# Patient Record
Sex: Female | Born: 1960 | Race: Black or African American | Hispanic: No | Marital: Single | Smoking: Never smoker
Health system: Southern US, Community
[De-identification: ages and names within clinical notes are randomized; demographics above are authoritative.]

## PROBLEM LIST (undated history)

## (undated) DIAGNOSIS — M549 Dorsalgia, unspecified: Secondary | ICD-10-CM

## (undated) DIAGNOSIS — M542 Cervicalgia: Secondary | ICD-10-CM

## (undated) DIAGNOSIS — R112 Nausea with vomiting, unspecified: Secondary | ICD-10-CM

## (undated) DIAGNOSIS — M25519 Pain in unspecified shoulder: Secondary | ICD-10-CM

## (undated) DIAGNOSIS — Z9889 Other specified postprocedural states: Secondary | ICD-10-CM

## (undated) DIAGNOSIS — M479 Spondylosis, unspecified: Secondary | ICD-10-CM

## (undated) DIAGNOSIS — R102 Pelvic and perineal pain: Secondary | ICD-10-CM

## (undated) HISTORY — DX: Pelvic and perineal pain: R10.2

## (undated) HISTORY — DX: Spondylosis, unspecified: M47.9

## (undated) HISTORY — PX: ABDOMINAL HYSTERECTOMY: SHX81

## (undated) HISTORY — DX: Pain in unspecified shoulder: M25.519

## (undated) HISTORY — DX: Cervicalgia: M54.2

## (undated) HISTORY — DX: Dorsalgia, unspecified: M54.9

---

## 1992-03-19 HISTORY — PX: NASAL SINUS SURGERY: SHX719

## 1996-03-19 HISTORY — PX: VESICOVAGINAL FISTULA CLOSURE W/ TAH: SUR271

## 1999-03-22 ENCOUNTER — Encounter: Payer: Self-pay | Admitting: Obstetrics and Gynecology

## 1999-03-22 ENCOUNTER — Encounter: Admission: RE | Admit: 1999-03-22 | Discharge: 1999-03-22 | Payer: Self-pay | Admitting: Obstetrics and Gynecology

## 2000-03-19 HISTORY — PX: OTHER SURGICAL HISTORY: SHX169

## 2000-06-04 ENCOUNTER — Encounter: Admission: RE | Admit: 2000-06-04 | Discharge: 2000-06-04 | Payer: Self-pay | Admitting: Obstetrics and Gynecology

## 2000-06-04 ENCOUNTER — Encounter: Payer: Self-pay | Admitting: Obstetrics and Gynecology

## 2002-02-04 ENCOUNTER — Other Ambulatory Visit: Admission: RE | Admit: 2002-02-04 | Discharge: 2002-02-04 | Payer: Self-pay | Admitting: Gynecology

## 2002-04-02 ENCOUNTER — Encounter: Payer: Self-pay | Admitting: Gynecology

## 2002-04-02 ENCOUNTER — Encounter: Admission: RE | Admit: 2002-04-02 | Discharge: 2002-04-02 | Payer: Self-pay | Admitting: Gynecology

## 2002-10-02 ENCOUNTER — Encounter: Payer: Self-pay | Admitting: Orthopedic Surgery

## 2002-12-21 ENCOUNTER — Encounter: Payer: Self-pay | Admitting: Orthopedic Surgery

## 2003-03-25 ENCOUNTER — Other Ambulatory Visit: Admission: RE | Admit: 2003-03-25 | Discharge: 2003-03-25 | Payer: Self-pay | Admitting: Gynecology

## 2003-04-13 ENCOUNTER — Encounter: Admission: RE | Admit: 2003-04-13 | Discharge: 2003-04-13 | Payer: Self-pay | Admitting: Gynecology

## 2004-01-18 ENCOUNTER — Ambulatory Visit: Payer: Self-pay | Admitting: Gastroenterology

## 2004-02-04 ENCOUNTER — Ambulatory Visit: Payer: Self-pay | Admitting: Gastroenterology

## 2004-02-24 ENCOUNTER — Ambulatory Visit: Payer: Self-pay | Admitting: Gastroenterology

## 2004-03-08 ENCOUNTER — Ambulatory Visit: Payer: Self-pay | Admitting: Orthopedic Surgery

## 2004-03-28 ENCOUNTER — Other Ambulatory Visit: Admission: RE | Admit: 2004-03-28 | Discharge: 2004-03-28 | Payer: Self-pay | Admitting: Gynecology

## 2004-06-26 ENCOUNTER — Ambulatory Visit: Payer: Self-pay | Admitting: Gastroenterology

## 2005-04-03 ENCOUNTER — Other Ambulatory Visit: Admission: RE | Admit: 2005-04-03 | Discharge: 2005-04-03 | Payer: Self-pay | Admitting: Gynecology

## 2005-04-20 ENCOUNTER — Encounter: Admission: RE | Admit: 2005-04-20 | Discharge: 2005-04-20 | Payer: Self-pay | Admitting: Gynecology

## 2005-08-23 ENCOUNTER — Ambulatory Visit: Payer: Self-pay | Admitting: Internal Medicine

## 2005-10-08 ENCOUNTER — Encounter: Payer: Self-pay | Admitting: Orthopedic Surgery

## 2005-12-03 ENCOUNTER — Ambulatory Visit: Payer: Self-pay | Admitting: Orthopedic Surgery

## 2005-12-24 ENCOUNTER — Ambulatory Visit: Payer: Self-pay | Admitting: Orthopedic Surgery

## 2006-01-14 ENCOUNTER — Ambulatory Visit: Payer: Self-pay | Admitting: Orthopedic Surgery

## 2006-02-12 ENCOUNTER — Ambulatory Visit: Payer: Self-pay | Admitting: Orthopedic Surgery

## 2006-04-11 ENCOUNTER — Other Ambulatory Visit: Admission: RE | Admit: 2006-04-11 | Discharge: 2006-04-11 | Payer: Self-pay | Admitting: Gynecology

## 2006-04-24 ENCOUNTER — Ambulatory Visit: Payer: Self-pay | Admitting: Cardiology

## 2007-04-03 ENCOUNTER — Ambulatory Visit: Payer: Self-pay | Admitting: Orthopedic Surgery

## 2007-04-03 DIAGNOSIS — M25519 Pain in unspecified shoulder: Secondary | ICD-10-CM | POA: Insufficient documentation

## 2007-04-18 ENCOUNTER — Encounter: Payer: Self-pay | Admitting: Orthopedic Surgery

## 2007-04-24 ENCOUNTER — Ambulatory Visit: Payer: Self-pay | Admitting: Orthopedic Surgery

## 2007-04-24 DIAGNOSIS — M7580 Other shoulder lesions, unspecified shoulder: Secondary | ICD-10-CM

## 2007-04-24 DIAGNOSIS — M25819 Other specified joint disorders, unspecified shoulder: Secondary | ICD-10-CM | POA: Insufficient documentation

## 2007-05-12 ENCOUNTER — Telehealth: Payer: Self-pay | Admitting: Orthopedic Surgery

## 2007-05-28 ENCOUNTER — Ambulatory Visit: Payer: Self-pay | Admitting: Orthopedic Surgery

## 2008-05-18 ENCOUNTER — Encounter: Payer: Self-pay | Admitting: Internal Medicine

## 2008-05-31 ENCOUNTER — Ambulatory Visit: Payer: Self-pay | Admitting: Orthopedic Surgery

## 2008-05-31 DIAGNOSIS — M5430 Sciatica, unspecified side: Secondary | ICD-10-CM | POA: Insufficient documentation

## 2008-05-31 DIAGNOSIS — M79609 Pain in unspecified limb: Secondary | ICD-10-CM | POA: Insufficient documentation

## 2008-05-31 DIAGNOSIS — M25559 Pain in unspecified hip: Secondary | ICD-10-CM | POA: Insufficient documentation

## 2008-05-31 DIAGNOSIS — M549 Dorsalgia, unspecified: Secondary | ICD-10-CM | POA: Insufficient documentation

## 2008-06-04 ENCOUNTER — Telehealth: Payer: Self-pay | Admitting: Orthopedic Surgery

## 2008-06-09 ENCOUNTER — Ambulatory Visit (HOSPITAL_COMMUNITY): Admission: RE | Admit: 2008-06-09 | Discharge: 2008-06-09 | Payer: Self-pay | Admitting: Orthopedic Surgery

## 2008-06-17 ENCOUNTER — Ambulatory Visit: Payer: Self-pay | Admitting: Orthopedic Surgery

## 2008-06-22 ENCOUNTER — Telehealth: Payer: Self-pay | Admitting: Orthopedic Surgery

## 2009-01-11 ENCOUNTER — Ambulatory Visit: Payer: Self-pay | Admitting: Orthopedic Surgery

## 2009-01-11 DIAGNOSIS — M7150 Other bursitis, not elsewhere classified, unspecified site: Secondary | ICD-10-CM | POA: Insufficient documentation

## 2009-01-27 ENCOUNTER — Ambulatory Visit: Payer: Self-pay | Admitting: Orthopedic Surgery

## 2009-03-16 ENCOUNTER — Telehealth: Payer: Self-pay | Admitting: Orthopedic Surgery

## 2009-04-11 ENCOUNTER — Ambulatory Visit: Payer: Self-pay | Admitting: Orthopedic Surgery

## 2009-04-11 DIAGNOSIS — M479 Spondylosis, unspecified: Secondary | ICD-10-CM | POA: Insufficient documentation

## 2009-04-11 HISTORY — DX: Spondylosis, unspecified: M47.9

## 2009-04-13 ENCOUNTER — Encounter: Payer: Self-pay | Admitting: Orthopedic Surgery

## 2009-04-14 ENCOUNTER — Telehealth: Payer: Self-pay | Admitting: Orthopedic Surgery

## 2009-04-19 ENCOUNTER — Telehealth: Payer: Self-pay | Admitting: Orthopedic Surgery

## 2009-04-21 ENCOUNTER — Telehealth: Payer: Self-pay | Admitting: Orthopedic Surgery

## 2009-04-22 ENCOUNTER — Telehealth (INDEPENDENT_AMBULATORY_CARE_PROVIDER_SITE_OTHER): Payer: Self-pay | Admitting: *Deleted

## 2009-04-22 ENCOUNTER — Telehealth: Payer: Self-pay | Admitting: Orthopedic Surgery

## 2009-04-26 ENCOUNTER — Ambulatory Visit (HOSPITAL_COMMUNITY): Admission: RE | Admit: 2009-04-26 | Discharge: 2009-04-26 | Payer: Self-pay | Admitting: Orthopedic Surgery

## 2009-04-28 ENCOUNTER — Telehealth: Payer: Self-pay | Admitting: Orthopedic Surgery

## 2009-05-02 ENCOUNTER — Ambulatory Visit: Payer: Self-pay | Admitting: Orthopedic Surgery

## 2009-05-10 ENCOUNTER — Encounter: Payer: Self-pay | Admitting: Orthopedic Surgery

## 2009-05-11 ENCOUNTER — Telehealth: Payer: Self-pay | Admitting: Orthopedic Surgery

## 2009-06-06 ENCOUNTER — Telehealth: Payer: Self-pay | Admitting: Orthopedic Surgery

## 2009-06-09 ENCOUNTER — Encounter: Payer: Self-pay | Admitting: Orthopedic Surgery

## 2009-06-21 ENCOUNTER — Encounter (INDEPENDENT_AMBULATORY_CARE_PROVIDER_SITE_OTHER): Payer: Self-pay | Admitting: *Deleted

## 2009-06-21 ENCOUNTER — Ambulatory Visit: Payer: Self-pay | Admitting: Orthopedic Surgery

## 2009-06-22 ENCOUNTER — Encounter (INDEPENDENT_AMBULATORY_CARE_PROVIDER_SITE_OTHER): Payer: Self-pay | Admitting: *Deleted

## 2009-06-22 ENCOUNTER — Encounter: Payer: Self-pay | Admitting: Orthopedic Surgery

## 2009-06-23 ENCOUNTER — Telehealth: Payer: Self-pay | Admitting: Orthopedic Surgery

## 2009-06-24 ENCOUNTER — Ambulatory Visit: Payer: Self-pay | Admitting: Orthopedic Surgery

## 2009-06-24 ENCOUNTER — Ambulatory Visit (HOSPITAL_COMMUNITY): Admission: RE | Admit: 2009-06-24 | Discharge: 2009-06-24 | Payer: Self-pay | Admitting: Orthopedic Surgery

## 2009-06-28 ENCOUNTER — Ambulatory Visit: Payer: Self-pay | Admitting: Orthopedic Surgery

## 2009-07-06 ENCOUNTER — Ambulatory Visit: Payer: Self-pay | Admitting: Orthopedic Surgery

## 2009-08-09 ENCOUNTER — Ambulatory Visit: Payer: Self-pay | Admitting: Orthopedic Surgery

## 2009-09-13 ENCOUNTER — Ambulatory Visit: Payer: Self-pay | Admitting: Orthopedic Surgery

## 2009-09-13 DIAGNOSIS — M7710 Lateral epicondylitis, unspecified elbow: Secondary | ICD-10-CM | POA: Insufficient documentation

## 2009-11-16 ENCOUNTER — Ambulatory Visit: Payer: Self-pay | Admitting: Orthopedic Surgery

## 2010-01-04 ENCOUNTER — Ambulatory Visit: Payer: Self-pay | Admitting: Orthopedic Surgery

## 2010-02-28 ENCOUNTER — Ambulatory Visit: Payer: Self-pay | Admitting: Orthopedic Surgery

## 2010-03-02 ENCOUNTER — Telehealth: Payer: Self-pay | Admitting: Orthopedic Surgery

## 2010-04-18 NOTE — Consult Note (Signed)
Summary: Office from Dr. Gerilyn Pilgrim  Office from Dr. Gerilyn Pilgrim   Imported By: Jacklynn Ganong 05/13/2009 09:15:23  _____________________________________________________________________  External Attachment:    Type:   Image     Comment:   External Document

## 2010-04-18 NOTE — Assessment & Plan Note (Signed)
Summary: 2 M RE-CHECK RT ELBOW/UHC/CAF   Visit Type:  Follow-up  CC:  recheck elbow.  History of Present Illness: I saw Sheila Patrick in the office today for a 2 month followup visit.  She is a 50 years old woman with the complaint of:  right elbow pain   s/p injection which helped for a short time.   Prednisone (pak) 5 Mg Tabs    uses biofreeze and ace bandage and this feels better.  SHE HAS NOT BEEN AS COMPLIANT WITH EXERCISES OR BRACING AND PREFERS ACE WRAP.      Allergies: 1)  ! * Some Dye in Laxatives  Physical Exam  Additional Exam:  RIGHT ELBOW  LATERAL TENDERNESS OVER THE RADILA HEAD ' ROM IS NORMAL AND FULL NEG TINELS  ELBOW IS STABLE      Impression & Recommendations:  Problem # 1:  LATERAL EPICONDYLITIS (ICD-726.32) Assessment Unchanged  Orders: Est. Patient Level II ([address])  Patient Instructions: 1)  Continue exercises for the elbow 2)  Use either ace wrap or elbow brace for 6 weeks continuously except for bathing 3)  Continue Biofreeze   4)  come back in 6 weeks   Orders Added: 1)  Est. Patient Level II [[address]454]

## 2010-04-18 NOTE — Letter (Signed)
Summary: FMLA form   FMLA form   Imported By: Cammie Sickle 04/23/2009 19:39:23  _____________________________________________________________________  External Attachment:    Type:   Image     Comment:   External Document

## 2010-04-18 NOTE — Progress Notes (Signed)
Summary: call from patient   Phone Note Call from Patient   Caller: Patient Summary of Call: Patient called to check on status of MRI, and to relay that she has also called chiropracter for appointment due to arm hurting and keeping her up at night. Initial call taken by: Cammie Sickle,  April 21, 2009 9:37 AM

## 2010-04-18 NOTE — Progress Notes (Signed)
Summary: getting second opinion  Phone Note Call from Patient   Summary of Call: Sheila Patrick(May 16, 2060) wanted you to know she is getting a second opinion on her hand. Her # [phone] Initial call taken by: Karenann Cai

## 2010-04-18 NOTE — Assessment & Plan Note (Signed)
Summary: mri results bringing disc/uhc/bsf   Visit Type:  Follow-up  CC:  right arm pain.  History of Present Illness: I saw Sheila Patrick in the office today for a followup visit.  She is a 50 years old woman with the complaint of:  right arm pain.  MRI results.  IMPRESSION: Essentially negative examination.  Very minimal disc bulges in the cervical region but no advanced disease.  No herniation or significant osteophytic disease.  No stenosis of the canal or foramina.   Read By:  Thomasenia Sales,  M.D.      she also has a pending. Lower extremity nerve conduction study. She is complaining of some back pain. She has an L5-S1 disc or possible S1 encroachment and that seems to bother her on the LEFT side as well.     Allergies: 1)  ! * Some Dye in Laxatives  Physical Exam  Additional Exam:  she has positive signs of impingement on the RIGHT. Strength in both rotator cuff pain with Hawkins maneuver and Neer maneuver. No instability. Neurovascular exam remained intact. Grossly, despite the symptoms of radicular pain in the RIGHT arm.   Impression & Recommendations:  Problem # 1:  ARTHRITIS, CERVICAL SPINE (ICD-721.90)  Orders: Est. 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 There were no complications  Orders: Est. Patient Level III ([address]) Joint Aspirate / Injection, Large (20610) Depo- Medrol 40mg  (J1030)  Problem # 3:  SCIATICA (ICD-724.3)  LEFT leg pain, pending. Lower extremity nerve conduction Her updated medication list for this problem includes:  neurontin 100mg  1-2 at bedtime # 90/5 refills     Ultram 50 Mg Tabs (Tramadol hcl) .[name]... 1 by mouth q 4 as needed pain  Orders: Est. Patient Level III (09811)  Patient Instructions: 1)  You have received an injection of cortisone today.  You may experience increased pain at the injection site. Apply ice pack to the area for 20 minutes every 2 hours and take 2 xtra strength tylenol every 8 hours. This increased pain will usually resolve in 24 hours. The injection will take effect in 3-10 days.  2)  return after nerve conduction study. Lower extremities. 3)  Continue with chiropractic treatment.

## 2010-04-18 NOTE — Assessment & Plan Note (Signed)
Summary: rt thumb schedule trigger release/uhc.cbt   Visit Type:  Follow-up  CC:  recheck right thumb.  History of Present Illness: 50 year old female with multiple joint aches recently got a 2nd opinion regarding her RIGHT thumb and was advised that she needs a trigger finger release.  She had nerve conduction studies, which were incomplete because the EMGs were done, but there was no radicular radiculopathy noted on the nerve conduction study portion.  She is eager to get a trigger release, done on the RIGHT thumb.  She has pain and tenderness over the A1 pulley with catching.   Goes to chiropractor for shoulder and arm.  05/10/09 NCS [place] in EMR for review.  Started Glucosamine and Chondroitin, no relief AFETR 3 WEEKS   NCS: NORMAL   Meds: No meds for pain.        Allergies: 1)  ! * Some Dye in Laxatives   Impression & Recommendations:  Problem # 1:  TRIGGER FINGER (ICD-727.3) Assessment Deteriorated  dictated history at a later date / incorp by ref   Orders: Est. Patient Level III ([address])  Medications Added to Medication List This Visit: 1)  Aspir-low 81 Mg Tbec (Aspirin)  Patient Instructions: 1)  postop appointment. 2)  She will call to advise [place] what must relaxer she took 3)  DOS 06/24/09 4)  Preop 06/22/09 at 3 o clock, take packet with you 5)  FU on 06/28/09

## 2010-04-18 NOTE — Progress Notes (Signed)
Summary: call from Dr Ronal Fear office, pt holding on appointment  Phone Note From Other Clinic   Caller: Referral Coordinator Summary of Call: Dawn from Dr Ronal Fear office, ph [phone], called to relay that she called patient to schedule referral appt/nerve conduction study; patient told her she wants to wait until after the MRI. Initial call taken by: Cammie Sickle,  April 14, 2009 2:20 PM

## 2010-04-18 NOTE — Letter (Signed)
Summary: *Consult Note  [facility]  [address]  [place], [place] [address]   Phone: [phone]  Fax: [phone]    Re:    Sheila Patrick DOB:    15-Mar-1961   Dear:    Danae Chen you for requesting that we see the above patient for consultation.  A copy of the detailed office note will be sent under separate cover, for your review.  Evaluation today is consistent with:    Our recommendation is for:    New Orders include:    New Medications started today include:    After today's visit, the patients current medications include: 1)  * CLARINEX  2)  * NASACORT AQ  3)  * CALCIUM  4)  ASPIR-LOW 81 MG TBEC (ASPIRIN)    Thank you for this consultation.  If you have any further questions regarding the care of this patient, please do not hesitate to contact me @   Thank you for this opportunity to look after your patient.  Sincerely,   Fuller Canada MD

## 2010-04-18 NOTE — Letter (Signed)
Summary: Out of Work  [facility]  [address]  [place], [place] [address]   Phone: [phone]  Fax: [phone]    July 06, 2009   Employee:  Sheila Patrick    To Whom It May Concern:   Return to work July 11, 2009  If you need additional information, please feel free to contact our office.         Sincerely,    Fuller Canada MD

## 2010-04-18 NOTE — Letter (Signed)
Summary: Work [facility]  [address]  [place], [place] [address]   Phone: [phone]  Fax: [phone]     Today's Date: June 21, 2009  Name of Patient: Sheila Patrick  The above named patient had a medical visit today at:  2:30 pm.  Please take this into consideration when reviewing the time away from work.   Special Instructions:  [ X ] To be off the remainder of today, returning to the normal work schedule tomorrow,  June 22, 2009.   [ X ] Other  Please excuse for: Patient / Employee appointment on June 22, 2009 at 3:00 (pre-operative appointment)  Please excuse for:  Out of work dates, secondary to surgery:  Start: June 24, 2009  End:  July 08, 2009  Estimated Return to work:   July 11, 2009   _____   Sincerely,   Terrance Mass, MD

## 2010-04-18 NOTE — Miscellaneous (Signed)
Summary: No pre-cert required for out-patient procedure  Clinical Lists Changes   Contacted insurer, [employer], regarding out-patient surgery (CPT code 69629, scheduled at [facility] - 06/24/09; per Aleatha Borer, no pre-cert is required for out-patient procedure, as in network.

## 2010-04-18 NOTE — Progress Notes (Signed)
Summary: MRI pending, waiting on clinical information, physician review  Phone Note Outgoing Call   Call placed to: Patient Summary of Call: Call to patient to inform of MRI status and request from [facility] insurance re: further review.  MRI appointment pending. * REF: Case # [id number], PH# [phone], option 4, physician. *  (Patient still wishes to hold on NCS scheduling with Dr Gerilyn Pilgrim until MRI is done). Initial call taken by: Cammie Sickle,  April 19, 2009 9:52 AM

## 2010-04-18 NOTE — Letter (Signed)
Summary: [facility] Medical Service Request  MRI C-spine  [facility] Medical Service Request  MRI C-spine   Imported By: Cammie Sickle 04/19/2009 09:55:05  _____________________________________________________________________  External Attachment:    Type:   Image     Comment:   External Document

## 2010-04-18 NOTE — Progress Notes (Signed)
Summary: Referral for NCS, lower extremities.  Phone Note Outgoing Call   Call placed by: Waldon Reining,  April 28, 2009 12:53 PM Call placed to: Specialist Action Taken: Information Sent Summary of Call: I refaxed a referral for this patient to Dr. Gerilyn Pilgrim  for NCS of lower extremities, for numbness in toes.

## 2010-04-18 NOTE — Assessment & Plan Note (Signed)
Summary: 2 M RE-CK RT ELBOW/POST OP HAND SURG 06/28/09/UHC/CAF   Visit Type:  Follow-up  CC:  right elbow.  History of Present Illness: I saw Sheila Patrick in the office today for a 2 month followup visit.  She is a 50 years old woman with the complaint of:  right elbow  Prednisone (pak) 5 Mg Tabs given on last visit. She took the medication and it did not really help.  She states she is a little better.  she's been worked up for several problems including pain in the cervical spine, pain in the RIGHT shoulder, elbow, had surgery in the RIGHT hand for triggering  Her RIGHT hand is improved in terms of the triggering.  She still has some lateral elbow pain the shoulder and neck appear to be improved  We've had nothing to really having I had him in terms of her upper extremity pain although its better it is still present she would really like to get rid of it  Today primarily focusing on the RIGHT elbow with pain on the lateral epicondyles she has tennis elbow exercises somewhat noncompliant with those and she also has a brace.  She does note that she's not had to do the same types of activities at work and this may have helped  She has tenderness over the lateral epicondyles.  She has a positive resistance test with the arm in extension and wrist resisted extension.  Tennis elbow recommend injection  Inject RIGHT elbow following technique The  elbow was prepped with alcohol and anesthetized with ethyl chloride. 40 mg of Depo-Medrol and 5 cc of 1% lidocaine were injected along the lateral epicondyle. No complications. Verbal consent was obtained prior to injection.    Allergies: 1)  ! * Some Dye in Laxatives   Impression & Recommendations:  Problem # 1:  LATERAL EPICONDYLITIS (ICD-726.32) Assessment New  Orders: Est. Patient Level III ([address]) Injection, Tendon / Ligament ([address]) Depo- Medrol 40mg  (J1030)  Patient Instructions: 1)  You have received an injection of  cortisone today. You may experience increased pain at the injection site. Apply ice pack to the area for 20 minutes every 2 hours and take 2 xtra strength tylenol every 8 hours. This increased pain will usually resolve in 24 hours. The injection will take effect in 3-10 days.  2)  1 1/2 months

## 2010-04-18 NOTE — Assessment & Plan Note (Signed)
Summary: RE-CK RT HAND/POST OP SURG 06/28/09/UHC/CAF   Visit Type:  Follow-up  CC:  right hand.  History of Present Illness: I saw Sheila Patrick in the office today for a followup visit.  She is a 50 years old woman with the complaint of:  right hand  06/24/09 right trigger thumb release.  No med needed.  She does complain of some discomfort proximal lateral forearm with some pain on power grip maneuvers, denies numbness tingling on the RIGHT forearm or hand  Does complain of some volar pain in the distal portion of the forearm  RIGHT trigger finger has been relieved.  Exam normal incision no numbness tingling in the thumb  Tenderness over the proximal forearm musculature involving the ECRB and ECRL no pain with extension of the wrist even with the elbow extended so she may have an intersection syndrome or nerve entrapment of the Lister interosseous nerve but there is no weakness  Pulses are good color is good no axillar adenopathy  Impression possible tennis elbow vs. 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 * Some Dye in Laxatives   Impression & Recommendations:  Problem # 1:  AFTERCARE FOLLOW SURGERY SKIN&SUBCUT TISSUE NEC (ICD-V58.77) Assessment Improved  Problem # 2:  ARTHRITIS, CERVICAL SPINE (ICD-721.90) Assessment: Deteriorated  prednisone Dosepak continue chiropractic care  Orders: Est. Patient Level III ([address])  Problem # 3:  TRIGGER FINGER (ICD-727.3) Assessment: Improved  Orders: Post-Op Check ([address])  Problem # 4:  LATERAL EPICONDYLITIS (ICD-726.32) Assessment: New  exercise program, tennis elbow brace, ice, prednisone dose, followup 2  months  Orders: Est. Patient Level III (09811)  Medications Added to Medication List This Visit: 1)  Prednisone (pak) 5 Mg Tabs (Prednisone) .... As directed  Patient Instructions: 1)  try prednisone pack  2)  tennis elbow exercises x 6 weeks  3)  apply ice to the arm 20 minutes 2-3 x  daily  4)  return in 2 months  Prescriptions: PREDNISONE (PAK) 5 MG TABS (PREDNISONE) as directed  #1 x 1   Entered and Authorized by:   Fuller Canada MD   Signed by:   Fuller Canada MD on 09/13/2009   Method used:   Print then Give to Patient   RxID:   [id number]

## 2010-04-18 NOTE — Progress Notes (Signed)
Summary: MRI appointment.  Phone Note Outgoing Call   Call placed by: Waldon Reining,  April 22, 2009 8:13 AM Call placed to: Patient Action Taken: Appt scheduled Summary of Call: The patient has an MRI appointment at [facility] on 04-26-09 at 7:30am. Patient has [facility], authorization [phone] and expires 45 days from 04-20-09. Patient will follow up back here for her results.

## 2010-04-18 NOTE — Progress Notes (Signed)
Summary: call from patient medication information  Phone Note Call from Patient   Caller: Patient Summary of Call: Patient called as per last office to relay name of medication: Flexoril 10 mg, 1 per day.  Left her work ph# to call if anything further needed. [facility] [employer] [facility] [phone]  NEED PHARMACY  Initial call taken by: Cammie Sickle,  June 23, 2009 8:49 AM  Follow-up for Phone Call        Pharmacy is [facility] [place] Follow-up by: Cammie Sickle,  June 23, 2009 9:27 AM  Additional Follow-up for Phone Call Additional follow up Details #1::        I faxed the med Additional Follow-up by: Ether Griffins,  June 23, 2009 9:31 AM    New/Updated Medications: FLEXERIL 10 MG TABS (CYCLOBENZAPRINE HCL) one by mouth q 8 hrs as needed Prescriptions: FLEXERIL 10 MG TABS (CYCLOBENZAPRINE HCL) one by mouth q 8 hrs as needed  #60 x 1   Entered by:   Ether Griffins   Authorized by:   Fuller Canada MD   Signed by:   Ether Griffins on 06/23/2009   Method used:   [facility]axed to ...       [facility]  [id number] (retail)       [address]       [place], [place]  [address]       Ph: [phone] or [phone]       Fax: [phone]   RxID:   [id number]

## 2010-04-18 NOTE — Miscellaneous (Signed)
Sheila Patrick Date of birth 67  8 1962 Medical record number: [id number]  Date June 21, 2009  Chief complaint catching and pain, RIGHT thumb  50 year old female presents with recurrent complaints of pain in the RIGHT thumb over the A1 pulley. She's had an injection. She still continues to complain of pain. She had a 2nd opinion by Dr. Wynonia Musty in [place] and he recommended trigger finger release. We discussed her treatment options, which include a repeat injection. She is, of course to taking oral pain medications, including anti-inflammatories, so she basically has been LEFT with the options of a trigger finger release were another injection. She would rather have the surgery. Informed consent was done in the office. She understands risk of nerve injury, and numbness to the finger, and recurrence of the triggering.  Current Allergies (reviewed today): ! * SOME DYE IN LAXATIVES  Past Medical History:    Reviewed history from 02/11/2007 and no changes required:       back pain       neck pain       shoulder pain  Past Surgical History:    Reviewed history from 02/11/2007 and no changes required:       rt breast lumpectomy in 2002       hysterectomy 1998       sinus surgery 1994    Risk Factors:   Review of Systems  General      Denies weight loss, weight gain, fever, chills, and fatigue.  Cardiac      Denies chest pain, angina, heart attack, heart failure, poor circulation, blood clots, and phlebitis.  Resp      Denies short of breath, difficulty breathing, COPD, cough, and pneumonia.  GI      Denies nausea, vomiting, diarrhea, constipation, difficulty swallowing, ulcers, GERD, and reflux.  GU      Denies kidney failure, kidney transplant, kidney stones, burning, poor stream, testicular cancer, blood in urine, and .  Neuro      Denies headache, dizziness, migraines, numbness, weakness, tremor, and unsteady walking.  MS      Denies joint pain, rheumatoid arthritis,  joint swelling, gout, bone cancer, osteoporosis, and .  Endo      Denies thyroid disease, goiter, and diabetes.  Psych      Denies depression, mood swings, anxiety, panic attack, bipolar, and schizophrenia.  Derm      Denies eczema, cancer, and itching.  EENT      Denies poor vision, cataracts, glaucoma, poor hearing, vertigo, ears ringing, sinusitis, hoarseness, toothaches, and bleeding gums.  Immunology      Denies seasonal allergies, sinus problems, and allergic to bee stings.  Lymphatic      Denies lymph node cancer and lymph edema.     Exam  General:    Well-developed, well-nourished, normal body habitus; no deformities, normal grooming.    Skin:    Intact, no scars, lesions, rashes, cafe au lait spots or bruising.    Inspection:    Inspection is normal.    Vascular:    Radial, ulnar, brachial, and axillary pulses 2+ and symmetric; capillary refill less than 2 seconds; no evidence of ischemia, clubbing, or cyanosis.    Sensory:    Gross sensation intact in the upper extremities.    Motor:    Normal strength in the upper extremities.   RIGHT thumb: No deformities are seen. There is tenderness over the A1 pulley. She has full range of motion, but painful flexion of the interphalangeal  joint. There is no instability. Normal flexion power.  Impression triggering recurrent RIGHT thumb.  Recommend trigger release, RIGHT thumb.  Fuller Canada , MD

## 2010-04-18 NOTE — Assessment & Plan Note (Signed)
Summary: POST OP 1/RT HAND/UHC/CAF   Visit Type:  post op   CC:  post op #1 and right hand.  History of Present Illness: postop check. #1 this is postop day 4 status post RIGHT thumb trigger release.  The wound looks fine. The patient can move the finger with no triggering. She is encouraged to do exercises, which I showed her. Follow up on the 20th for stitch removal   Allergies: 1)  ! * Some Dye in Laxatives   Medications Added to Medication List This Visit: 1)  Norco 5-325 Mg Tabs (Hydrocodone-acetaminophen) .[name].. 1 by mouth q 4 as needed pain 2)  Promethazine Hcl 25 Mg Tabs (Promethazine hcl) .[name].. 1 by mouth q 4 as needed nausea    may take 30 min before pain med  Other Orders: Post-Op Check (23557)  Patient Instructions: 1)  Remove sutures on the 20th  Prescriptions: PROMETHAZINE HCL 25 MG TABS (PROMETHAZINE HCL) 1 by mouth q 4 as needed nausea    may take 30 min before pain med  #42 x 0   Entered and Authorized by:   Fuller Canada MD   Signed by:   Fuller Canada MD on 06/28/2009   Method used:   Print then Give to Patient   RxID:   [id number] NORCO 5-325 MG TABS (HYDROCODONE-ACETAMINOPHEN) 1 by mouth q 4 as needed pain  #40 x 0   Entered and Authorized by:   Fuller Canada MD   Signed by:   Fuller Canada MD on 06/28/2009   Method used:   Print then Give to Patient   RxID:   [id number]

## 2010-04-18 NOTE — Assessment & Plan Note (Signed)
Summary: 1 M RE-CI/POST OP SURG RT HAND 07/24/09/UHC/CAF   Visit Type:  Follow-up  CC:  recheck rt hand post op.  History of Present Illness: I saw Sheila Patrick in the office today for a followup visit.  She is a 50 years old woman with the complaint of:  post op 3   06/24/09 right trigger thumb release.  No med needed.  doing well some soreness, no locking  flexion is normal   Gripping better.  WOUND TENDERNESS BUT NEG DIGITAL N TINELS          Allergies: 1)  ! * Some Dye in Laxatives   Impression & Recommendations:  Problem # 1:  AFTERCARE FOLLOW SURGERY SKIN&SUBCUT TISSUE NEC (ICD-V58.77) Assessment Comment Only  Orders: Post-Op Check ([address])  Problem # 2:  TRIGGER FINGER (ICD-727.3) Assessment: Comment Only  Orders: Post-Op Check ([address])  Patient Instructions: 1)  schedule for neuroma injection

## 2010-04-18 NOTE — Progress Notes (Signed)
Summary: neurontin not helping  Phone Note Call from Patient Call back at Home Phone [phone]   Summary of Call: pt called stating that she cannot take neurontin anymore, it is not helping, makes her too sleepy and makes her constipated, she wanted me to let you know that she is going to start taking Aleve two times a day, any other suggestions for her? Initial call taken by: Ether Griffins,  May 11, 2009 2:04 PM  Follow-up for Phone Call        no other suggestions good choice Follow-up by: Fuller Canada MD,  May 12, 2009 7:57 AM  Additional Follow-up for Phone Call Additional follow up Details #1::        advised pt on her answering machine Additional Follow-up by: Ether Griffins,  May 12, 2009 9:05 AM

## 2010-04-18 NOTE — Letter (Signed)
Summary: Office note DR D. DALSIMER  Office note DR D. DALSIMER   Imported By: Cammie Sickle 06/21/2009 15:50:28  _____________________________________________________________________  External Attachment:    Type:   Image     Comment:   External Document

## 2010-04-18 NOTE — Medication Information (Signed)
Summary: Tax adviser   Imported By: Cammie Sickle 04/19/2009 17:20:02  _____________________________________________________________________  External Attachment:    Type:   Image     Comment:   External Document

## 2010-04-18 NOTE — Assessment & Plan Note (Signed)
Summary: POST OP 2/REM SUTURES/SURG RT HAND 07/24/09/[facility]   Visit Type:  Follow-up  CC:  post op 2 hand.  History of Present Illness: I saw Sheila Patrick in the office today for a followup visit.  She is a 50 years old woman with the complaint of:  post op 2 remove stitches.  06/24/09 right trigger thumb release.  POD 12.  Norco 5 for pain.  doing well  flexion is normal   wound healed no erythema       Allergies: 1)  ! * Some Dye in Laxatives   Impression & Recommendations:  Problem # 1:  AFTERCARE FOLLOW SURGERY SKIN&SUBCUT TISSUE NEC (ICD-V58.77)  Orders: Post-Op Check (16109)  Patient Instructions: 1)  keep covered x 1 week 2)  continue flexion exercises  3)  return in 1 month

## 2010-04-18 NOTE — Assessment & Plan Note (Signed)
Summary: hand and arm pain/frs   Visit Type:  Follow-up  CC:  Right hand and arm pain .  History of Present Illness: I saw Sheila Patrick in the office today for a followup visit.  She is a 50 years old woman with the complaint of:  hand and arm pain.  Patient states the Tramadol made her sick and vomit. Pain comes and goes.  ROS: NUMBNESS LEFT FOOT-toes   MEDS: NONE OTHER THAN ADVIL  Sheila Patrick continues to have significant problems with the RIGHT upper extremity and also the LEFT foot. Both areas have intermittent numbness and pain, especially the RIGHT upper extremity.  She had an MRI in 2007, which shows a bulging disc with cervical levels 2 through 6.  Recommend repeating those studies at this time. also recommend starting some gabapentin 100 mg at night. She is not very medicine tolerant and neck. She has trouble taking Advil after a couple of days.    Allergies: 1)  ! * Some Dye in Laxatives   Impression & Recommendations:  Problem # 1:  ARTHRITIS, CERVICAL SPINE (ICD-721.90) Assessment Unchanged  Orders: Est. Patient Level II (16109)  Problem # 2:  SCIATICA (ICD-724.3) Assessment: Unchanged  Her updated medication list for this problem includes:    Ultram 50 Mg Tabs (Tramadol hcl) .[name]... 1 by mouth q 4 as needed pain  Orders: Est. Patient Level II (60454)  Medications Added to Medication List This Visit: 1)  Neurontin 100 Mg Caps (Gabapentin) .[name].. 1 by mouth hs  Patient Instructions: 1)  Nerve study lower extrmity [toes are numb] 2)  MRI C spine right arm pain  Prescriptions: NEURONTIN 100 MG CAPS (GABAPENTIN) 1 by mouth hs  #30 x 0   Entered and Authorized by:   Fuller Canada MD   Signed by:   Fuller Canada MD on 04/11/2009   Method used:   Print then Give to Patient   RxID:   [id number]

## 2010-04-18 NOTE — Progress Notes (Signed)
Summary: lmom about mri appt 04/26/09 aph  Phone Note Outgoing Call Call back at [facility] Phone [phone]   Summary of Call: called and left message on machine about mri appt and advised her to call the office to confirm mri appt and to schedule a fu appt for results Initial call taken by: Peggye Poon Tereasa Coop,  April 22, 2009 11:01 AM

## 2010-04-20 NOTE — Assessment & Plan Note (Signed)
Summary: 6 WK RE-CK RT ELBOW/[facility]   Visit Type:  Follow-up  CC:  right elbow.  History of Present Illness: I saw Sheila Patrick in the office today for a 6 week  followup visit.  She is a 50 years old woman with the complaint of:  right elbow and upper right arm pain.  Problem list #1 RIGHT tennis elbow.  #2 cervical spine pain.  #3 shoulder pain.  Tennis elbow strap, and being treated with exercise program, and tennis elbow brace.  Patient also has pain in the biceps tendon area, as well as the upper mid trap area.  Uses biofreeze, no meds.  Does not want any injections today.         Allergies: 1)  ! * Some Dye in Laxatives  Review of Systems Neurologic:  tingling and burning, upper trap.  Physical Exam  Additional Exam:  There is a positive Spurling sign on the RIGHT.  There is a positive speed's test. Negative Yergason's test. RIGHT biceps bicipital groove tenderness.  Negative. Impingement sign.  Full range of motion and flexion of the RIGHT shoulder rotator cuff seems intact.     Impression & Recommendations:  Problem # 1:  LATERAL EPICONDYLITIS (ICD-726.32)  Orders: Est. Patient Level III (81191)  Problem # 2:  ARTHRITIS, CERVICAL SPINE (ICD-721.90)  Orders: Est. Patient Level III (47829)  Problem # 3:  SHOULDER PAIN (ICD-719.41)  Her updated medication list for this problem includes:    Aspir-low 81 Mg Tbec (Aspirin)    Flexeril 10 Mg Tabs (Cyclobenzaprine hcl) ..... One by mouth q 8 hrs as needed    Norco 5-325 Mg Tabs (Hydrocodone-acetaminophen) .[name]... 1 by mouth q 4 as needed pain  Orders: Est. Patient Level III (56213)  Patient Instructions: 1)  try flector patches  2)  continue topical creams  3)  f/u  4)  Please schedule a follow-up appointment in 3 months.   Orders Added: 1)  Est. Patient Level III [08657]

## 2010-04-20 NOTE — Progress Notes (Signed)
Summary: call back to patient  ---- Converted from flag ---- ---- 02/28/2010 2:30 PM, Cammie Sickle wrote: Left patient voice message.  Received call back 03/02/10. Advised patient.  ---- 02/28/2010 2:30 PM, Fuller Canada MD wrote: yes  ---- 02/28/2010 2:23 PM, Cammie Sickle wrote: [place] Knock for Sheila Patrick to continue to do pushups?  She does "lay off" when arm/elbow starts hurting? ------------------------------

## 2010-05-17 ENCOUNTER — Encounter: Payer: Self-pay | Admitting: Orthopedic Surgery

## 2010-05-25 ENCOUNTER — Other Ambulatory Visit: Payer: Self-pay | Admitting: Gynecology

## 2010-05-25 DIAGNOSIS — N6459 Other signs and symptoms in breast: Secondary | ICD-10-CM

## 2010-05-29 ENCOUNTER — Telehealth: Payer: Self-pay | Admitting: Orthopedic Surgery

## 2010-05-30 ENCOUNTER — Ambulatory Visit: Payer: Self-pay | Admitting: Orthopedic Surgery

## 2010-06-06 NOTE — Progress Notes (Signed)
Summary: Patient called to re-schedule  Phone Note Call from Patient   Caller: Patient Summary of Call: Patient called to re-schedule appointment for tomorrow, 05/30/10. States has other issues going on and prefers to wait until May. Appointment re-scheduled for then as per patient request. Initial call taken by: Cammie Sickle,  May 29, 2010 10:06 AM

## 2010-06-07 LAB — BASIC METABOLIC PANEL
BUN: 14 mg/dL (ref 6–23)
Calcium: 9.1 mg/dL (ref 8.4–10.5)
GFR calc non Af Amer: >60 mL/min (ref >60)
Potassium: 4.1 mEq/L (ref 3.5–5.1)

## 2010-06-07 LAB — CBC
HCT: 33.1 % — ABNORMAL LOW (ref 36.0–46.0)
Platelets: 259 10*3/uL (ref 150–400)
WBC: 5.8 10*3/uL (ref 4.0–10.5)

## 2010-08-04 NOTE — Assessment & Plan Note (Signed)
[facility]                          [facility] OFFICE NOTE   Sheila Patrick, Sheila Patrick                      MRN:          [id number]  DATE:04/24/2006                            DOB:          04/20/1960    NEW PATIENT WORKUP   HISTORY OF PRESENT ILLNESS:  This patient is a 50 year old female with  atypical chest pain and dyspnea.  The patient states that she is very  anxious and has been under a lot of stress.  She reports that at various  times, but not necessarily on exertion, she has some difficulty  breathing.  She states that she has some decrease in exercise tolerance  although she has joined a gym.  She states on occasion also that she has  palpitations, which also, again, occur at rest, but predominately appear  to be occurring under stressful circumstances.  The patient, today, did  not want an EKG done due to the cost associated with this; she has a  $400 deductible.  I tried to convince her that this is a basic but she  has declined.  She reports that she has occasional sharp, right-side  pain which is knife-like in its appearance.  She also reports that her  palpitations appear to have worsened with conjunction of the use of  Wellbutrin for depression.   ALLERGIES:  __________ causes hives.   PAST MEDICAL HISTORY:  History of reflux.  History of allergic rhinitis.  History of carpal tunnel syndrome.   FAMILY HISTORY:  Father deceased at age 52 from throat cancer.  She also  has a sister who had a myocardial infarction.   SOCIAL HISTORY:  The patient denies any smoking or alcohol use.  The  patient is single.  She has no children.  She works for [employer].   MEDICATIONS:  1. Singulair 10 mg p.o. q.a.m.  2. Clarinex 5 mg p.r.n.  3. Nabumetone 5 mg b.i.d.  4. Vitamin B6 taking 100 mg p.o. b.i.d.  5. Estrogen patch __________ .  6. Albuterol 350 mg q.a.m.  7. Multivitamin.  8. Omega-3 fish oil.   REVIEW OF SYSTEMS:  As per HPI.   No nausea or vomiting.  No fever or  chills.  No melena, hematochezia, dysuria, or frequency.  No presyncope  or syncope.  No bruising.  No claudication.  The remainder of the review  of systems is negative.   PHYSICAL EXAMINATION:  VITAL SIGNS:  Blood pressure 115/70, heart rate  88 beats per minute.  GENERAL:  A well-nourished African-American female in no apparent  distress.  HEENT:  Pupils are round.  Conjunctivae are clear.  NECK:  Supple.  Normal carotid upstroke and no current bruits.  LUNGS:  Clear breath sounds bilaterally.  HEART:  Regular rate and rhythm.  Normal S1-S2.  No murmurs, rubs, or  gallops.  ABDOMEN:  Soft nontender, no rebound or guarding.  Good bowel sounds.  EXTREMITIES:  No cyanosis, clubbing or edema.  The patient is alert,  oriented, and grossly nonfocal.   PROBLEM LIST:  1. Palpitations.  2. Atypical chest pain.  3. Dyspnea.  4. History of reflux.  5. History of mild asthma.  6. Normal pulmonary function tests and normal chest x-ray.  7. Dyslipidemia with elevated LDL.   PLAN:  1. The patient's symptoms are very atypical and I agree with Dr.      Margo Common that they may all be related to her increased stress level      and possibly even related to some of her medications that she takes      for depression.  2. The patient will be screened with an electrocardiogram and a stress      echocardiographic study which will be done in the next couple of      days.  She has a sister who had a myocardial infarction and does      want to be reassured that she has no high risk coronary disease.  3. I told the patient in regards to her dyslipidemia that she      definitely needs to cut back on her carbohydrates which she does      appear eating somewhat in excess.  4. Echocardiographic study, also, will rule out the possibility of      mitral valve prolapse or pulmonary hypertension.  If this study is      within normal limits.  I do not think that this patient  needs a      further cardiac workup.  5. In regards to her palpitations attempt could be made to wean her      antidepressants; although, I think, that her palpitations are      predominately related to anxiety and stress.     Learta Codding, MD,FACC  Electronically Signed    GED/MedQ  DD: 04/24/2006  DT: 04/24/2006  Job #: [id number]   cc:   Wyvonnia Lora

## 2010-08-09 ENCOUNTER — Encounter: Payer: Self-pay | Admitting: Orthopedic Surgery

## 2010-08-09 ENCOUNTER — Ambulatory Visit (INDEPENDENT_AMBULATORY_CARE_PROVIDER_SITE_OTHER): Payer: 59 | Admitting: Orthopedic Surgery

## 2010-08-09 DIAGNOSIS — G5760 Lesion of plantar nerve, unspecified lower limb: Secondary | ICD-10-CM | POA: Insufficient documentation

## 2010-08-09 DIAGNOSIS — M7710 Lateral epicondylitis, unspecified elbow: Secondary | ICD-10-CM

## 2010-08-09 NOTE — Progress Notes (Signed)
Routine followup  The patient says her neck is doing well as is her elbow  She wants to have her LEFT foot check for persistent numbness and tingling in the third fourth and fifth digits.  She had I nerve conduction study which ruled out any nerve compression proximally  She has some pain associated with this as well.  She has a positive squeeze test and a positive nerve compression test for Morton's neuroma she has some tenderness in the metatarsal heads as well.    Possible Morton's neuroma  Recommend metatarsal pads.  Patient did not want injection  Followup as needed

## 2011-05-31 ENCOUNTER — Other Ambulatory Visit: Payer: Self-pay | Admitting: Gynecology

## 2011-05-31 DIAGNOSIS — Z1231 Encounter for screening mammogram for malignant neoplasm of breast: Secondary | ICD-10-CM

## 2011-06-13 ENCOUNTER — Ambulatory Visit: Payer: 59

## 2011-06-29 ENCOUNTER — Ambulatory Visit
Admission: RE | Admit: 2011-06-29 | Discharge: 2011-06-29 | Disposition: A | Discharge status: Home / Self Care | Payer: 59 | Source: Ambulatory Visit | Attending: Gynecology | Admitting: Gynecology

## 2011-06-29 DIAGNOSIS — Z1231 Encounter for screening mammogram for malignant neoplasm of breast: Secondary | ICD-10-CM

## 2012-09-30 ENCOUNTER — Other Ambulatory Visit: Payer: Self-pay

## 2012-09-30 DIAGNOSIS — Z1231 Encounter for screening mammogram for malignant neoplasm of breast: Secondary | ICD-10-CM

## 2012-10-17 ENCOUNTER — Ambulatory Visit
Admission: RE | Admit: 2012-10-17 | Discharge: 2012-10-17 | Disposition: A | Discharge status: Home / Self Care | Payer: 59 | Source: Ambulatory Visit

## 2012-10-17 DIAGNOSIS — Z1231 Encounter for screening mammogram for malignant neoplasm of breast: Secondary | ICD-10-CM

## 2014-01-25 ENCOUNTER — Other Ambulatory Visit: Payer: Self-pay | Admitting: Physician Assistant

## 2014-04-16 ENCOUNTER — Other Ambulatory Visit: Payer: Self-pay

## 2014-04-16 DIAGNOSIS — N6459 Other signs and symptoms in breast: Secondary | ICD-10-CM

## 2014-04-27 ENCOUNTER — Other Ambulatory Visit: Payer: Self-pay

## 2014-04-27 DIAGNOSIS — Z1231 Encounter for screening mammogram for malignant neoplasm of breast: Secondary | ICD-10-CM

## 2014-04-29 ENCOUNTER — Ambulatory Visit
Admission: RE | Admit: 2014-04-29 | Discharge: 2014-04-29 | Disposition: A | Discharge status: Home / Self Care | Payer: 59 | Source: Ambulatory Visit

## 2014-04-29 DIAGNOSIS — Z1231 Encounter for screening mammogram for malignant neoplasm of breast: Secondary | ICD-10-CM

## 2014-05-25 ENCOUNTER — Other Ambulatory Visit: Payer: Self-pay | Admitting: Obstetrics and Gynecology

## 2014-05-25 DIAGNOSIS — N63 Unspecified lump in unspecified breast: Secondary | ICD-10-CM

## 2014-06-08 ENCOUNTER — Other Ambulatory Visit: Payer: 59

## 2014-06-09 ENCOUNTER — Ambulatory Visit
Admission: RE | Admit: 2014-06-09 | Discharge: 2014-06-09 | Disposition: A | Discharge status: Home / Self Care | Payer: 59 | Source: Ambulatory Visit | Attending: Obstetrics and Gynecology | Admitting: Obstetrics and Gynecology

## 2014-06-09 DIAGNOSIS — N63 Unspecified lump in unspecified breast: Secondary | ICD-10-CM

## 2016-01-20 ENCOUNTER — Other Ambulatory Visit: Payer: Self-pay | Admitting: Obstetrics and Gynecology

## 2016-01-20 DIAGNOSIS — R5381 Other malaise: Secondary | ICD-10-CM

## 2016-01-31 ENCOUNTER — Other Ambulatory Visit: Payer: Self-pay | Admitting: Obstetrics and Gynecology

## 2016-01-31 DIAGNOSIS — M8580 Other specified disorders of bone density and structure, unspecified site: Secondary | ICD-10-CM

## 2016-03-29 ENCOUNTER — Ambulatory Visit
Admission: RE | Admit: 2016-03-29 | Discharge: 2016-03-29 | Disposition: A | Discharge status: Home / Self Care | Payer: 59 | Source: Ambulatory Visit | Attending: Obstetrics and Gynecology | Admitting: Obstetrics and Gynecology

## 2016-03-29 DIAGNOSIS — M85852 Other specified disorders of bone density and structure, left thigh: Secondary | ICD-10-CM | POA: Diagnosis not present

## 2016-03-29 DIAGNOSIS — Z780 Asymptomatic menopausal state: Secondary | ICD-10-CM | POA: Diagnosis not present

## 2016-03-29 DIAGNOSIS — M8580 Other specified disorders of bone density and structure, unspecified site: Secondary | ICD-10-CM

## 2016-05-09 DIAGNOSIS — M79604 Pain in right leg: Secondary | ICD-10-CM | POA: Diagnosis not present

## 2016-05-17 DIAGNOSIS — S8391XA Sprain of unspecified site of right knee, initial encounter: Secondary | ICD-10-CM | POA: Diagnosis not present

## 2016-05-17 DIAGNOSIS — S338XXA Sprain of other parts of lumbar spine and pelvis, initial encounter: Secondary | ICD-10-CM | POA: Diagnosis not present

## 2016-05-22 DIAGNOSIS — M79604 Pain in right leg: Secondary | ICD-10-CM | POA: Diagnosis not present

## 2016-05-22 DIAGNOSIS — M79605 Pain in left leg: Secondary | ICD-10-CM | POA: Diagnosis not present

## 2016-05-22 DIAGNOSIS — M79651 Pain in right thigh: Secondary | ICD-10-CM | POA: Diagnosis not present

## 2016-05-23 DIAGNOSIS — S8391XA Sprain of unspecified site of right knee, initial encounter: Secondary | ICD-10-CM | POA: Diagnosis not present

## 2016-05-23 DIAGNOSIS — S338XXA Sprain of other parts of lumbar spine and pelvis, initial encounter: Secondary | ICD-10-CM | POA: Diagnosis not present

## 2016-05-30 DIAGNOSIS — S8391XA Sprain of unspecified site of right knee, initial encounter: Secondary | ICD-10-CM | POA: Diagnosis not present

## 2016-05-30 DIAGNOSIS — S338XXA Sprain of other parts of lumbar spine and pelvis, initial encounter: Secondary | ICD-10-CM | POA: Diagnosis not present

## 2016-06-04 DIAGNOSIS — S338XXA Sprain of other parts of lumbar spine and pelvis, initial encounter: Secondary | ICD-10-CM | POA: Diagnosis not present

## 2016-06-04 DIAGNOSIS — S8391XA Sprain of unspecified site of right knee, initial encounter: Secondary | ICD-10-CM | POA: Diagnosis not present

## 2016-06-07 DIAGNOSIS — S338XXA Sprain of other parts of lumbar spine and pelvis, initial encounter: Secondary | ICD-10-CM | POA: Diagnosis not present

## 2016-06-07 DIAGNOSIS — S8391XA Sprain of unspecified site of right knee, initial encounter: Secondary | ICD-10-CM | POA: Diagnosis not present

## 2016-06-08 DIAGNOSIS — J309 Allergic rhinitis, unspecified: Secondary | ICD-10-CM | POA: Diagnosis not present

## 2016-06-08 DIAGNOSIS — E559 Vitamin D deficiency, unspecified: Secondary | ICD-10-CM | POA: Diagnosis not present

## 2016-06-08 DIAGNOSIS — Z0000 Encounter for general adult medical examination without abnormal findings: Secondary | ICD-10-CM | POA: Diagnosis not present

## 2016-06-14 DIAGNOSIS — S8391XA Sprain of unspecified site of right knee, initial encounter: Secondary | ICD-10-CM | POA: Diagnosis not present

## 2016-06-14 DIAGNOSIS — S338XXA Sprain of other parts of lumbar spine and pelvis, initial encounter: Secondary | ICD-10-CM | POA: Diagnosis not present

## 2016-06-21 DIAGNOSIS — S338XXA Sprain of other parts of lumbar spine and pelvis, initial encounter: Secondary | ICD-10-CM | POA: Diagnosis not present

## 2016-06-21 DIAGNOSIS — S8391XA Sprain of unspecified site of right knee, initial encounter: Secondary | ICD-10-CM | POA: Diagnosis not present

## 2016-07-04 DIAGNOSIS — S338XXA Sprain of other parts of lumbar spine and pelvis, initial encounter: Secondary | ICD-10-CM | POA: Diagnosis not present

## 2016-07-04 DIAGNOSIS — S8391XA Sprain of unspecified site of right knee, initial encounter: Secondary | ICD-10-CM | POA: Diagnosis not present

## 2016-07-10 DIAGNOSIS — Z6821 Body mass index (BMI) 21.0-21.9, adult: Secondary | ICD-10-CM | POA: Diagnosis not present

## 2016-07-10 DIAGNOSIS — E559 Vitamin D deficiency, unspecified: Secondary | ICD-10-CM | POA: Diagnosis not present

## 2016-07-10 DIAGNOSIS — Z01419 Encounter for gynecological examination (general) (routine) without abnormal findings: Secondary | ICD-10-CM | POA: Diagnosis not present

## 2016-07-10 DIAGNOSIS — R8589 Other abnormal findings in specimens from digestive organs and abdominal cavity: Secondary | ICD-10-CM | POA: Diagnosis not present

## 2016-07-18 DIAGNOSIS — S8391XA Sprain of unspecified site of right knee, initial encounter: Secondary | ICD-10-CM | POA: Diagnosis not present

## 2016-07-18 DIAGNOSIS — S338XXA Sprain of other parts of lumbar spine and pelvis, initial encounter: Secondary | ICD-10-CM | POA: Diagnosis not present

## 2016-08-01 DIAGNOSIS — S8391XA Sprain of unspecified site of right knee, initial encounter: Secondary | ICD-10-CM | POA: Diagnosis not present

## 2016-08-01 DIAGNOSIS — S338XXA Sprain of other parts of lumbar spine and pelvis, initial encounter: Secondary | ICD-10-CM | POA: Diagnosis not present

## 2016-08-14 DIAGNOSIS — S8391XA Sprain of unspecified site of right knee, initial encounter: Secondary | ICD-10-CM | POA: Diagnosis not present

## 2016-08-14 DIAGNOSIS — S338XXA Sprain of other parts of lumbar spine and pelvis, initial encounter: Secondary | ICD-10-CM | POA: Diagnosis not present

## 2016-09-07 DIAGNOSIS — I83813 Varicose veins of bilateral lower extremities with pain: Secondary | ICD-10-CM | POA: Diagnosis not present

## 2016-10-04 DIAGNOSIS — M79651 Pain in right thigh: Secondary | ICD-10-CM | POA: Diagnosis not present

## 2016-10-04 DIAGNOSIS — M79604 Pain in right leg: Secondary | ICD-10-CM | POA: Diagnosis not present

## 2016-10-04 DIAGNOSIS — I83813 Varicose veins of bilateral lower extremities with pain: Secondary | ICD-10-CM | POA: Diagnosis not present

## 2016-10-30 ENCOUNTER — Encounter: Payer: 59 | Admitting: Vascular Surgery

## 2016-11-22 DIAGNOSIS — H52203 Unspecified astigmatism, bilateral: Secondary | ICD-10-CM | POA: Diagnosis not present

## 2016-11-22 DIAGNOSIS — H524 Presbyopia: Secondary | ICD-10-CM | POA: Diagnosis not present

## 2017-04-10 ENCOUNTER — Ambulatory Visit: Payer: 59 | Admitting: Family Medicine

## 2017-04-10 ENCOUNTER — Encounter: Payer: Self-pay | Admitting: Family Medicine

## 2017-04-10 VITALS — BP 111/66 | HR 78 | Temp 97.2°F | Ht 65.0 in | Wt 126.0 lb

## 2017-04-10 DIAGNOSIS — M8580 Other specified disorders of bone density and structure, unspecified site: Secondary | ICD-10-CM | POA: Insufficient documentation

## 2017-04-10 DIAGNOSIS — N952 Postmenopausal atrophic vaginitis: Secondary | ICD-10-CM | POA: Insufficient documentation

## 2017-04-10 DIAGNOSIS — Z780 Asymptomatic menopausal state: Secondary | ICD-10-CM | POA: Insufficient documentation

## 2017-04-10 DIAGNOSIS — R102 Pelvic and perineal pain: Secondary | ICD-10-CM | POA: Insufficient documentation

## 2017-04-10 DIAGNOSIS — N94819 Vulvodynia, unspecified: Secondary | ICD-10-CM | POA: Insufficient documentation

## 2017-04-10 DIAGNOSIS — Z7689 Persons encountering health services in other specified circumstances: Secondary | ICD-10-CM | POA: Diagnosis not present

## 2017-04-10 DIAGNOSIS — F419 Anxiety disorder, unspecified: Secondary | ICD-10-CM | POA: Insufficient documentation

## 2017-04-10 DIAGNOSIS — M810 Age-related osteoporosis without current pathological fracture: Secondary | ICD-10-CM

## 2017-04-10 HISTORY — DX: Pelvic and perineal pain: R10.2

## 2017-04-10 NOTE — Patient Instructions (Signed)
It was a pleasure seeing you today, Ms Sheila Patrick. I value your feedback and appreciate you entrusting us with your care.  If you get a survey, I would appreciate your taking the time to let us know what your experience was like.  Please feel free to call our office if any questions or concerns arise.  Warm Regards, Cecelia Graciano M. Nadine CountsGottschalk, DO

## 2017-04-10 NOTE — Progress Notes (Signed)
Subjective: ZO:XWRUEAVWU care, osteopenia HPI: Sheila Patrick is a 57 y.o. female presenting to clinic today for:  1.  Osteopenia Patient reports that she is overall healthy except for a recent DEXA scan which she had performed noting osteopenia with a T score of -1.8.  She reports a balanced diet that is high in calcium and vitamin D but she is unable to quantify how much she is in taking.  She does supplement with vitamin D regularly but does not supplement with calcium.  She has never been treated for osteopenia with any oral medications.  She does report being physically active and attempts to maintain a healthy lifestyle.  2.  Preventative care Patient reports that she gets mammograms performed every 2 years.  She is due for mammogram this year.  She sees a GYN annually, Dr. Huel Cote in [place].  She has a history of a total abdominal hysterectomy.  She no longer gets Pap smears.  Last colonoscopy was almost 10 years ago.  She thinks she is coming up due for colonoscopy but has not received a card to schedule an appointment yet.  Denies any unusual breast lumps, rectal bleeding, nausea, vomiting, abdominal pain.  Surgical history significant for lumpectomy in the right breast several years ago.  This was determined to be benign.  No family history of breast cancer, prostate cancer, ovarian cancer or colon cancers.  Past Medical History:  Diagnosis Date  . Back pain   . Neck pain   . Shoulder pain    Past Surgical History:  Procedure Laterality Date  . ABDOMINAL HYSTERECTOMY     Total abdominal  . NASAL SINUS SURGERY  1994  . right breast lumpectomy  2002  . VESICOVAGINAL FISTULA CLOSURE W/ TAH  1998   Social History   Socioeconomic History  . Marital status: Single    Spouse name: Not on file  . Number of children: 0  . Years of education: Not on file  . Highest education level: Not on file  Social Needs  . Financial resource strain: Not on file  . Food  insecurity - worry: Not on file  . Food insecurity - inability: Not on file  . Transportation needs - medical: Not on file  . Transportation needs - non-medical: Not on file  Occupational History  . Occupation: Materials engineer  Tobacco Use  . Smoking status: Never Smoker  . Smokeless tobacco: Never Used  Substance and Sexual Activity  . Alcohol use: Yes    Comment: occas  . Drug use: No  . Sexual activity: No    Comment: single  Other Topics Concern  . Not on file  Social History Narrative  . Not on file   Current Meds  Medication Sig  . cetirizine (ZYRTEC) 10 MG chewable tablet Chew 10 mg by mouth daily.  . cholecalciferol (VITAMIN D) 1000 units tablet Take 1,000 Units by mouth daily.   Family History  Problem Relation Age of Onset  . Asthma Mother   . Cancer Father        lung  . Lung cancer Father   . Asthma Sister   . Hypertension Sister   . Cancer Brother        ?laryngeal  . COPD Sister   . Heart disease Sister   . Hyperlipidemia Sister   . Hypertension Sister   . Diabetes Sister   . Heart attack Sister 55  . Cirrhosis Brother   . Alcohol abuse Brother  No Known Allergies   Health Maintenance: ?flu, TDap  ROS: Per HPI  Objective: Office vital signs reviewed. BP 111/66   Pulse 78   Temp (!) 97.2 F (36.2 C) (Oral)   Ht 5\' 5"  (1.651 m)   Wt 126 lb (57.2 kg)   BMI 20.97 kg/m   Physical Examination:  General: Awake, alert, well nourished, well appearing female, No acute distress HEENT: sclera white, MMM Cardio: regular rate and rhythm, S1S2 heard, no murmurs appreciated Pulm: clear to auscultation bilaterally, no wheezes, rhonchi or rales; normal work of breathing on room air MSK: Ambulates independently, normal tone Extremities: Warm, well-perfused no edema Neuro: Follows all commands, no focal neurologic deficits Psych: Mood stable, speech normal, affect appropriate, pleasant, interactive  No flowsheet data found.  Assessment/  Plan: 57 y.o. female   Encounter to establish care with new doctor Will obtain medical records from previous PCP, Dr. Margo Commonapper.  Patient appears to be in good health.  Recommended that she schedule mammogram with gynecologist or she is welcome to schedule here.  I discussed that we offer this bimonthly.  Will await records from previous PCP with regards to past colonoscopy.  Patient will likely need to set up an appointment within the next year if she is on a 10-year interval.  We will plan to obtain basic labs and screen for HIV and hepatitis C if needed in March during her physical exam.   Osteopenia after menopause Went through early menopause and 28 secondary to total abdominal hysterectomy for uterine fibroids.  She had a DEXA scan performed in January 2018 which demonstrated osteopenia with a T score of -1.8.  Fracture score for major osteoporotic fracture was 3.3% in the next 10 years. We reviewed calcium and vitamin D intake.  A list of calcium and vitamin D rich foods were provided to the patient.  If she is unable to eat sufficient nutrient rich foods, I did recommend that she supplement with 400 international units of vitamin D daily and 1200 mg of calcium daily.  Continue daily weight baring exercise.  We will plan to recheck DEXA in 2020.   Raliegh IpAshly M Demmi Sindt, DO [facility] [phone]

## 2017-04-10 NOTE — Assessment & Plan Note (Addendum)
Went through early menopause and 28 secondary to total abdominal hysterectomy for uterine fibroids.  She had a DEXA scan performed in January 2018 which demonstrated osteopenia with a T score of -1.8.  Fracture score for major osteoporotic fracture was 3.3% in the next 10 years. We reviewed calcium and vitamin D intake.  A list of calcium and vitamin D rich foods were provided to the patient.  If she is unable to eat sufficient nutrient rich foods, I did recommend that she supplement with 400 international units of vitamin D daily and 1200 mg of calcium daily.  Continue daily weight baring exercise.  We will plan to recheck DEXA in 2020.

## 2017-06-10 NOTE — Progress Notes (Signed)
Sheila Patrick is a 57 y.o. female presents to office today for annual physical exam examination.    Concerns today include: None  Occupation: recently laid off from [employer]; was an Chartered certified accountant, Marital status: Single, Substance use: none Diet: balanced, Exercise: regular Last eye exam: q year, wears glasses Last dental exam: q6 months Last colonoscopy: age 21, normal Last mammogram: about 2 years ago, normal.  Gets with OB/GYN Last pap smear: has hysterectomy. Refills needed today: not on any medications. Immunizations needed: FTdap Vaccine: yes, declines   Past Medical History:  Diagnosis Date  . Back pain   . Neck pain   . Shoulder pain    Social History   Socioeconomic History  . Marital status: Single    Spouse name: Not on file  . Number of children: 0  . Years of education: Not on file  . Highest education level: Not on file  Occupational History  . Occupation: Chartered certified accountant  Social Needs  . Financial resource strain: Not on file  . Food insecurity:    Worry: Not on file    Inability: Not on file  . Transportation needs:    Medical: Not on file    Non-medical: Not on file  Tobacco Use  . Smoking status: Never Smoker  . Smokeless tobacco: Never Used  Substance and Sexual Activity  . Alcohol use: Yes    Comment: occas  . Drug use: No  . Sexual activity: Never    Comment: single  Lifestyle  . Physical activity:    Days per week: Not on file    Minutes per session: Not on file  . Stress: Not on file  Relationships  . Social connections:    Talks on phone: Not on file    Gets together: Not on file    Attends religious service: Not on file    Active member of club or organization: Not on file    Attends meetings of clubs or organizations: Not on file    Relationship status: Not on file  . Intimate partner violence:    Fear of current or ex partner: Not on file    Emotionally abused: Not on file    Physically abused: Not on  file    Forced sexual activity: Not on file  Other Topics Concern  . Not on file  Social History Narrative  . Not on file   Past Surgical History:  Procedure Laterality Date  . ABDOMINAL HYSTERECTOMY     Total abdominal  . NASAL SINUS SURGERY  1994  . right breast lumpectomy  2002  . VESICOVAGINAL FISTULA CLOSURE W/ TAH  1998   Family History  Problem Relation Age of Onset  . Asthma Mother   . Cancer Father        lung  . Lung cancer Father   . Asthma Sister   . Hypertension Sister   . Cancer Brother        ?laryngeal  . COPD Sister   . Heart disease Sister   . Hyperlipidemia Sister   . Hypertension Sister   . Diabetes Sister   . Heart attack Sister 61  . Cirrhosis Brother   . Alcohol abuse Brother     Current Outpatient Medications:  .  cetirizine (ZYRTEC) 10 MG chewable tablet, Chew 10 mg by mouth daily., Disp: , Rfl:  .  cholecalciferol (VITAMIN D) 1000 units tablet, Take 1,000 Units by mouth daily., Disp: , Rfl:    ROS: Review  of Systems Constitutional: negative Eyes: positive for contacts/glasses Ears, nose, mouth, throat, and face: negative Respiratory: negative Cardiovascular: negative Gastrointestinal: negative Genitourinary:negative Integument/breast: negative Hematologic/lymphatic: negative Musculoskeletal:negative Neurological: negative Behavioral/Psych: negative Endocrine: negative Allergic/Immunologic: negative    Physical exam BP 109/69   Pulse 68   Temp 98.6 F (37 C) (Oral)   Ht _0  (1.651 m)   Wt 121 lb (54.9 kg)   BMI 20.14 kg/m  General appearance: alert, cooperative, appears stated age and no distress Head: Normocephalic, without obvious abnormality, atraumatic Eyes: negative findings: lids and lashes normal, conjunctivae and sclerae normal, corneas clear and pupils equal, round, reactive to light and accomodation Ears: normal TM's and external ear canals both ears Nose: Nares normal. Septum midline. Mucosa normal. No  drainage or sinus tenderness. Throat: lips, mucosa, and tongue normal; teeth and gums normal Neck: no adenopathy, no carotid bruit, supple, symmetrical, trachea midline and thyroid not enlarged, symmetric, no tenderness/mass/nodules Back: symmetric, no curvature. ROM normal. No CVA tenderness. Lungs: clear to auscultation bilaterally Heart: regular rate and rhythm, S1, S2 normal, no murmur, click, rub or gallop Abdomen: soft, non-tender; bowel sounds normal; no masses,  no organomegaly Extremities: extremities normal, atraumatic, no cyanosis or edema Pulses: 2+ and symmetric Skin: Skin color, texture, turgor normal. No rashes or lesions Lymph nodes: Cervical, supraclavicular, and axillary nodes normal. Neurologic: Grossly normal; 1/4 patellar DTRs bilaterally Psych: mood stable, speech normal, affect appropriate, good eye contact.   Office Visit from 06/11/2017 in [place]  PHQ-2 Total Score  0     Assessment/ Plan: Sheila Patrick here for annual physical exam.   1. Annual physical exam Patient declines Tdap today.  She already has had her influenza this year.  Not due for screening colonoscopy until 2020.  She will schedule a mammogram with her GYN at her upcoming appointment.  2. Osteopenia after menopause Eats a well-balanced diet and supplement with vitamin D in her daily protein shakes.  Check vitamin D and calcium in the setting of osteopenia appreciated on 2018 DEXA scan. - CMP14+EGFR - VITAMIN D 25 Hydroxy (Vit-D Deficiency, Fractures)  3. Screening for metabolic disorder - ERD40+CXKG  4. Screening for lipid disorders - Lipid Panel  5. Encounter for hepatitis C screening test for low risk patient - Hepatitis C antibody  6. Screening for HIV (human immunodeficiency virus) - HIV antibody (with reflex)  7. Screening for deficiency anemia - CBC with Differential   Counseled on healthy lifestyle choices, including diet (rich in fruits,  vegetables and lean meats and low in salt and simple carbohydrates) and exercise (at least 30 minutes of moderate physical activity daily).  Patient to follow up in 1 year for annual exam or sooner if needed.  Ashly M. Lajuana Ripple, DO

## 2017-06-11 ENCOUNTER — Ambulatory Visit (INDEPENDENT_AMBULATORY_CARE_PROVIDER_SITE_OTHER): Payer: 59 | Admitting: Family Medicine

## 2017-06-11 ENCOUNTER — Encounter: Payer: Self-pay | Admitting: Family Medicine

## 2017-06-11 VITALS — BP 109/69 | HR 68 | Temp 98.6°F | Ht 65.0 in | Wt 121.0 lb

## 2017-06-11 DIAGNOSIS — N6459 Other signs and symptoms in breast: Secondary | ICD-10-CM | POA: Insufficient documentation

## 2017-06-11 DIAGNOSIS — Z780 Asymptomatic menopausal state: Secondary | ICD-10-CM

## 2017-06-11 DIAGNOSIS — M810 Age-related osteoporosis without current pathological fracture: Secondary | ICD-10-CM | POA: Diagnosis not present

## 2017-06-11 DIAGNOSIS — M8580 Other specified disorders of bone density and structure, unspecified site: Secondary | ICD-10-CM

## 2017-06-11 DIAGNOSIS — Z114 Encounter for screening for human immunodeficiency virus [HIV]: Secondary | ICD-10-CM

## 2017-06-11 DIAGNOSIS — Z0000 Encounter for general adult medical examination without abnormal findings: Secondary | ICD-10-CM

## 2017-06-11 DIAGNOSIS — Z13220 Encounter for screening for lipoid disorders: Secondary | ICD-10-CM | POA: Diagnosis not present

## 2017-06-11 DIAGNOSIS — Z13228 Encounter for screening for other metabolic disorders: Secondary | ICD-10-CM

## 2017-06-11 DIAGNOSIS — N649 Disorder of breast, unspecified: Secondary | ICD-10-CM | POA: Insufficient documentation

## 2017-06-11 DIAGNOSIS — Z1159 Encounter for screening for other viral diseases: Secondary | ICD-10-CM

## 2017-06-11 DIAGNOSIS — Z130 Encounter for screening for diseases of the blood and blood-forming organs and certain disorders involving the immune mechanism: Secondary | ICD-10-CM

## 2017-06-11 NOTE — Patient Instructions (Addendum)
You had labs performed today.  You will be contacted with the results of the labs once they are available, usually in the next 3 days for routine lab work.  If you have not scheduled your mammogram, please do so.  Remember that this is offered here twice monthly.  You can schedule this at checkout.  If you have not scheduled your colonoscopy, please do so.  Alternatives to colonoscopy include Cologuard.  If this is something that interests you let me know and I can order it for you.  Cologuard (colon cancer DNA test) is an every 3 years colon cancer screening test.   Colorectal Cancer Screening Colorectal cancer screening is a group of tests used to check for colorectal cancer. Colorectal refers to your colon and rectum. Your colon and rectum are located at the end of your large intestine and carry your bowel movements out of your body. Why is colorectal cancer screening done? It is common for abnormal growths (polyps) to form in the lining of your colon, especially as you get older. These polyps can be cancerous or become cancerous. If colorectal cancer is found at an early stage, it is treatable. Who should be screened for colorectal cancer? Screening is recommended for all adults at average risk starting at age 59. Tests may be recommended every 1 to 10 years. Your health care provider may recommend earlier or more frequent screening if you have:  A history of colorectal cancer or polyps.  A family member with a history of colorectal cancer or polyps.  Inflammatory bowel disease, such as ulcerative colitis or Crohn disease.  A type of hereditary colon cancer syndrome.  Colorectal cancer symptoms.  Types of screening tests There are several types of colorectal screening tests. They include:  Guaiac-based fecal occult blood testing.  Fecal immunochemical test (FIT).  Stool DNA test.  Barium enema.  Virtual colonoscopy.  Sigmoidoscopy. During this test, a sigmoidoscope is used to  examine your rectum and lower colon. A sigmoidoscope is a flexible tube with a camera that is inserted through your anus into your rectum and lower colon.  Colonoscopy. During this test, a colonoscope is used to examine your entire colon. A colonoscope is a long, thin, flexible tube with a camera. This test examines your entire colon and rectum.  This information is not intended to replace advice given to you by your health care provider. Make sure you discuss any questions you have with your health care provider. Document Released: 08/23/2009 Document Revised: 10/13/2015 Document Reviewed: 06/11/2013 Elsevier Interactive Patient Education  [employer]2018 [employer]

## 2017-06-12 ENCOUNTER — Telehealth: Payer: Self-pay | Admitting: Family Medicine

## 2017-06-12 ENCOUNTER — Encounter: Payer: Self-pay | Admitting: *Deleted

## 2017-06-12 LAB — CBC WITH DIFFERENTIAL/PLATELET
BASOS ABS: 0 10*3/uL (ref 0.0–0.2)
Basos: 0 %
EOS (ABSOLUTE): 0.1 10*3/uL (ref 0.0–0.4)
Eos: 2 %
Hematocrit: 37 % (ref 34.0–46.6)
Hemoglobin: 12.3 g/dL (ref 11.1–15.9)
IMMATURE GRANS (ABS): 0 10*3/uL (ref 0.0–0.1)
Immature Granulocytes: 0 %
LYMPHS: 35 %
Lymphocytes Absolute: 1.7 10*3/uL (ref 0.7–3.1)
MCH: 29.1 pg (ref 26.6–33.0)
MCHC: 33.2 g/dL (ref 31.5–35.7)
MCV: 88 fL (ref 79–97)
Monocytes Absolute: 0.3 10*3/uL (ref 0.1–0.9)
Monocytes: 6 %
NEUTROS ABS: 2.8 10*3/uL (ref 1.4–7.0)
Neutrophils: 57 %
PLATELETS: 310 10*3/uL (ref 150–379)
RBC: 4.22 x10E6/uL (ref 3.77–5.28)
RDW: 14.3 % (ref 12.3–15.4)
WBC: 4.9 10*3/uL (ref 3.4–10.8)

## 2017-06-12 LAB — CMP14+EGFR
A/G RATIO: 1.6 (ref 1.2–2.2)
ALBUMIN: 4.3 g/dL (ref 3.5–5.5)
ALK PHOS: 67 IU/L (ref 39–117)
ALT: 10 IU/L (ref 0–32)
AST: 17 IU/L (ref 0–40)
BUN / CREAT RATIO: 10 (ref 9–23)
BUN: 9 mg/dL (ref 6–24)
Bilirubin Total: 0.7 mg/dL (ref 0.0–1.2)
CHLORIDE: 98 mmol/L (ref 96–106)
CO2: 25 mmol/L (ref 20–29)
Calcium: 9.4 mg/dL (ref 8.7–10.2)
Creatinine, Ser: 0.86 mg/dL (ref 0.57–1.00)
GFR calc Af Amer: 86 mL/min/{1.73_m2} (ref >59)
GFR calc non Af Amer: 75 mL/min/{1.73_m2} (ref >59)
GLOBULIN, TOTAL: 2.7 g/dL (ref 1.5–4.5)
Glucose: 93 mg/dL (ref 65–99)
Potassium: 3.9 mmol/L (ref 3.5–5.2)
Sodium: 139 mmol/L (ref 134–144)
Total Protein: 7 g/dL (ref 6.0–8.5)

## 2017-06-12 LAB — LIPID PANEL
CHOLESTEROL TOTAL: 220 mg/dL — AB (ref 100–199)
Chol/HDL Ratio: 2.9 ratio (ref 0.0–4.4)
HDL: 75 mg/dL (ref >39)
LDL CALC: 134 mg/dL — AB (ref 0–99)
Triglycerides: 53 mg/dL (ref 0–149)
VLDL Cholesterol Cal: 11 mg/dL (ref 5–40)

## 2017-06-12 LAB — HEPATITIS C ANTIBODY

## 2017-06-12 LAB — VITAMIN D 25 HYDROXY (VIT D DEFICIENCY, FRACTURES): VIT D 25 HYDROXY: 38.2 ng/mL (ref 30.0–100.0)

## 2017-06-12 LAB — HIV ANTIBODY (ROUTINE TESTING W REFLEX): HIV SCREEN 4TH GENERATION: NONREACTIVE

## 2017-06-12 NOTE — Telephone Encounter (Signed)
Aware. 

## 2017-07-11 DIAGNOSIS — Z01419 Encounter for gynecological examination (general) (routine) without abnormal findings: Secondary | ICD-10-CM | POA: Diagnosis not present

## 2017-07-11 DIAGNOSIS — Z130 Encounter for screening for diseases of the blood and blood-forming organs and certain disorders involving the immune mechanism: Secondary | ICD-10-CM | POA: Diagnosis not present

## 2017-07-11 DIAGNOSIS — Z6821 Body mass index (BMI) 21.0-21.9, adult: Secondary | ICD-10-CM | POA: Diagnosis not present

## 2017-07-11 DIAGNOSIS — Z1231 Encounter for screening mammogram for malignant neoplasm of breast: Secondary | ICD-10-CM | POA: Diagnosis not present

## 2017-09-30 DIAGNOSIS — H43812 Vitreous degeneration, left eye: Secondary | ICD-10-CM | POA: Diagnosis not present

## 2017-11-12 DIAGNOSIS — H43812 Vitreous degeneration, left eye: Secondary | ICD-10-CM | POA: Diagnosis not present

## 2017-12-10 ENCOUNTER — Encounter: Payer: Self-pay | Admitting: Family Medicine

## 2017-12-10 ENCOUNTER — Ambulatory Visit: Payer: 59 | Admitting: Family Medicine

## 2017-12-10 VITALS — BP 128/77 | HR 80 | Temp 98.2°F | Ht 65.0 in | Wt 119.8 lb

## 2017-12-10 DIAGNOSIS — J069 Acute upper respiratory infection, unspecified: Secondary | ICD-10-CM

## 2017-12-10 DIAGNOSIS — B9789 Other viral agents as the cause of diseases classified elsewhere: Secondary | ICD-10-CM

## 2017-12-10 NOTE — Progress Notes (Signed)
Subjective: CC: coungestion PCP: Raliegh Ip, DO ZOX:WRUEA R Sheila is a 57 y.o. female presenting to clinic today for:  1. Chest cough--Patient has been having runny nose, left ear pain, and headache starting 3 weeks ago. Her symptoms have progressively improved. She currently has a chest cough that is only sometimes productive with clear sputum. She has not had any fever, chills, SOB, hemoptysis, dysphagia, or chest pain. She has been taking Zyrtec, black seed oil, and elderberry that has been helping with her symptoms.   ROS: Per HPI  No Known Allergies Past Medical History:  Diagnosis Date  . Back pain   . Neck pain   . Shoulder pain     Current Outpatient Medications:  .  cetirizine (ZYRTEC) 10 MG chewable tablet, Chew 10 mg by mouth daily., Disp: , Rfl:  .  cholecalciferol (VITAMIN D) 1000 units tablet, Take 1,000 Units by mouth daily., Disp: , Rfl:  Social History   Socioeconomic History  . Marital status: Single    Spouse name: Not on file  . Number of children: 0  . Years of education: Not on file  . Highest education level: Not on file  Occupational History  . Occupation: Materials engineer  Social Needs  . Financial resource strain: Not on file  . Food insecurity:    Worry: Not on file    Inability: Not on file  . Transportation needs:    Medical: Not on file    Non-medical: Not on file  Tobacco Use  . Smoking status: Never Smoker  . Smokeless tobacco: Never Used  Substance and Sexual Activity  . Alcohol use: Yes    Comment: occas  . Drug use: No  . Sexual activity: Never    Comment: single  Lifestyle  . Physical activity:    Days per week: Not on file    Minutes per session: Not on file  . Stress: Not on file  Relationships  . Social connections:    Talks on phone: Not on file    Gets together: Not on file    Attends religious service: Not on file    Active member of club or organization: Not on file    Attends meetings of clubs  or organizations: Not on file    Relationship status: Not on file  . Intimate partner violence:    Fear of current or ex partner: Not on file    Emotionally abused: Not on file    Physically abused: Not on file    Forced sexual activity: Not on file  Other Topics Concern  . Not on file  Social History Narrative  . Not on file   Family History  Problem Relation Age of Onset  . Asthma Mother   . Cancer Father        lung  . Lung cancer Father   . Asthma Sister   . Hypertension Sister   . Cancer Brother        ?laryngeal  . COPD Sister   . Heart disease Sister   . Hyperlipidemia Sister   . Hypertension Sister   . Diabetes Sister   . Heart attack Sister 67  . Cirrhosis Brother   . Alcohol abuse Brother     Objective: Office vital signs reviewed. BP 128/77   Pulse 80   Temp 98.2 F (36.8 C) (Oral)   Ht 5\' 5"  (1.651 m)   Wt 54.3 kg   SpO2 99%   BMI 19.94 kg/m  Physical Examination:  General: Awake, alert, well nourished, No acute distress HEENT: Normal    Neck: No masses palpated. No lymphadenopathy    Ears: Tympanic membranes intact, normal light reflex, no erythema, no bulging    Eyes: PERRLA, extraocular membranes intact, sclera white    Nose: nasal turbinates moist, minimal nasal discharge    Throat: moist mucus membranes, no erythema, no tonsillar exudate.  Airway is patent Cardio: regular rate and rhythm, S1S2 heard, no murmurs appreciated Pulm: clear to auscultation bilaterally, no wheezes, rhonchi or rales; normal work of breathing on room air GI: soft, non-tender, non-distended, no hepatomegaly, no splenomegaly, no masses Skin: dry; intact; no rashes or lesions  Assessment/ Plan: 57 y.o. female   Upper respiratory infection Continue taking Zyrtec, black seed oil, and elderberry. Start taking Mucinex or Flonase as needed. Stay hydrated with fluids and continue to stay active. If symptoms progress or cough worsens, follow up with the office.  Sheila HoseMeredith  Tearah Saulsbury, PA-S Patient seen and examined in the presence of Delynn FlavinAshly Gottschalk, DO

## 2018-03-25 ENCOUNTER — Encounter: Payer: Self-pay | Admitting: Family Medicine

## 2018-03-25 ENCOUNTER — Ambulatory Visit (INDEPENDENT_AMBULATORY_CARE_PROVIDER_SITE_OTHER): Payer: 59 | Admitting: Family Medicine

## 2018-03-25 VITALS — BP 126/71 | HR 70 | Temp 98.1°F | Ht 65.0 in | Wt 116.0 lb

## 2018-03-25 DIAGNOSIS — S90122A Contusion of left lesser toe(s) without damage to nail, initial encounter: Secondary | ICD-10-CM

## 2018-03-25 NOTE — Progress Notes (Signed)
Chief Complaint  Patient presents with  . Toe Injury    left, little    HPI  Patient presents today for pain in the left fifth toe.  Onset this morning after hitting his toes on bathroom cabinet as she was getting ready for work.  She can walk on it.  However it is somewhat painful.  Patient declines x-ray.  She is concerned about cost.  PMH: Smoking status noted ROS: Per HPI  Objective: BP 126/71   Pulse 70   Temp 98.1 F (36.7 C) (Oral)   Ht 5\' 5"  (1.651 m)   Wt 116 lb (52.6 kg)   BMI 19.30 kg/m  Gen: NAD, alert, cooperative with exam Ext: No edema, warm.  Full range of motion left lower extremity.  Neurovascularly intact.  There is tenderness at the base of the fifth toe near the MTP joint.  There is minimal edema without erythema.  Some tenderness with passive range of motion. Neuro: Alert and oriented, No gross deficits  Assessment and plan:  1. Contusion of fifth toe of left foot, initial encounter     Patient declined x-ray and she declined a postop shoe.  She thinks she has a sister with a foot orthotic boot at home that would do the same thing as a postop shoe.  She wants to check that before committing to a postop shoe.  She should wear that for a minimum of 2 weeks.  Ibuprofen over-the-counter along with ice elevation and staying off the foot should assist in her recovery.  Follow up as needed.  Mechele Claude, MD

## 2018-06-13 ENCOUNTER — Encounter: Payer: 59 | Admitting: Family Medicine

## 2018-07-24 ENCOUNTER — Ambulatory Visit: Payer: 59 | Admitting: Family Medicine

## 2018-08-04 ENCOUNTER — Other Ambulatory Visit: Payer: Self-pay

## 2018-08-05 ENCOUNTER — Encounter: Payer: Self-pay | Admitting: Family Medicine

## 2018-08-05 ENCOUNTER — Ambulatory Visit (INDEPENDENT_AMBULATORY_CARE_PROVIDER_SITE_OTHER): Payer: 59 | Admitting: Family Medicine

## 2018-08-05 VITALS — BP 125/73 | HR 68 | Temp 98.0°F | Ht 65.0 in | Wt 111.0 lb

## 2018-08-05 DIAGNOSIS — Z2821 Immunization not carried out because of patient refusal: Secondary | ICD-10-CM

## 2018-08-05 DIAGNOSIS — R3121 Asymptomatic microscopic hematuria: Secondary | ICD-10-CM

## 2018-08-05 DIAGNOSIS — Z13220 Encounter for screening for lipoid disorders: Secondary | ICD-10-CM

## 2018-08-05 DIAGNOSIS — M810 Age-related osteoporosis without current pathological fracture: Secondary | ICD-10-CM

## 2018-08-05 DIAGNOSIS — Z0001 Encounter for general adult medical examination with abnormal findings: Secondary | ICD-10-CM | POA: Diagnosis not present

## 2018-08-05 DIAGNOSIS — Z0000 Encounter for general adult medical examination without abnormal findings: Secondary | ICD-10-CM

## 2018-08-05 DIAGNOSIS — Z1211 Encounter for screening for malignant neoplasm of colon: Secondary | ICD-10-CM

## 2018-08-05 DIAGNOSIS — M8580 Other specified disorders of bone density and structure, unspecified site: Secondary | ICD-10-CM

## 2018-08-05 LAB — URINALYSIS
Bilirubin, UA: NEGATIVE
Glucose, UA: NEGATIVE
Ketones, UA: NEGATIVE
Leukocytes,UA: NEGATIVE
Nitrite, UA: NEGATIVE
Protein,UA: NEGATIVE
Specific Gravity, UA: 1.01 (ref 1.005–1.030)
Urobilinogen, Ur: 0.2 mg/dL (ref 0.2–1.0)
pH, UA: 7 (ref 5.0–7.5)

## 2018-08-05 NOTE — Patient Instructions (Addendum)
Your last DEXA was in 03/2016.  You are due for this.  I will have Carlon call you to schedule an appointment for June.  Health Maintenance, Female Adopting a healthy lifestyle and getting preventive care can go a long way to promote health and wellness. Talk with your health care provider about what schedule of regular examinations is right for you. This is a good chance for you to check in with your provider about disease prevention and staying healthy. In between checkups, there are plenty of things you can do on your own. Experts have done a lot of research about which lifestyle changes and preventive measures are most likely to keep you healthy. Ask your health care provider for more information. Weight and diet Eat a healthy diet  Be sure to include plenty of vegetables, fruits, low-fat dairy products, and lean protein.  Do not eat a lot of foods high in solid fats, added sugars, or salt.  Get regular exercise. This is one of the most important things you can do for your health. ? Most adults should exercise for at least 150 minutes each week. The exercise should increase your heart rate and make you sweat (moderate-intensity exercise). ? Most adults should also do strengthening exercises at least twice a week. This is in addition to the moderate-intensity exercise. Maintain a healthy weight  Body mass index (BMI) is a measurement that can be used to identify possible weight problems. It estimates body fat based on height and weight. Your health care provider can help determine your BMI and help you achieve or maintain a healthy weight.  For females 55 years of age and older: ? A BMI below 18.5 is considered underweight. ? A BMI of 18.5 to 24.9 is normal. ? A BMI of 25 to 29.9 is considered overweight. ? A BMI of 30 and above is considered obese. Watch levels of cholesterol and blood lipids  You should start having your blood tested for lipids and cholesterol at 60 years of age, then  have this test every 5 years.  You may need to have your cholesterol levels checked more often if: ? Your lipid or cholesterol levels are high. ? You are older than 60 years of age. ? You are at high risk for heart disease. Cancer screening Lung Cancer  Lung cancer screening is recommended for adults 3-41 years old who are at high risk for lung cancer because of a history of smoking.  A yearly low-dose CT scan of the lungs is recommended for people who: ? Currently smoke. ? Have quit within the past 15 years. ? Have at least a 30-pack-year history of smoking. A pack year is smoking an average of one pack of cigarettes a day for 1 year.  Yearly screening should continue until it has been 15 years since you quit.  Yearly screening should stop if you develop a health problem that would prevent you from having lung cancer treatment. Breast Cancer  Practice breast self-awareness. This means understanding how your breasts normally appear and feel.  It also means doing regular breast self-exams. Let your health care provider know about any changes, no matter how small.  If you are in your 20s or 30s, you should have a clinical breast exam (CBE) by a health care provider every 1-3 years as part of a regular health exam.  If you are 54 or older, have a CBE every year. Also consider having a breast X-ray (mammogram) every year.  If you have  a family history of breast cancer, talk to your health care provider about genetic screening.  If you are at high risk for breast cancer, talk to your health care provider about having an MRI and a mammogram every year.  Breast cancer gene (BRCA) assessment is recommended for women who have family members with BRCA-related cancers. BRCA-related cancers include: ? Breast. ? Ovarian. ? Tubal. ? Peritoneal cancers.  Results of the assessment will determine the need for genetic counseling and BRCA1 and BRCA2 testing. Cervical Cancer Your health care  provider may recommend that you be screened regularly for cancer of the pelvic organs (ovaries, uterus, and vagina). This screening involves a pelvic examination, including checking for microscopic changes to the surface of your cervix (Pap test). You may be encouraged to have this screening done every 3 years, beginning at age 34.  For women ages 32-65, health care providers may recommend pelvic exams and Pap testing every 3 years, or they may recommend the Pap and pelvic exam, combined with testing for human papilloma virus (HPV), every 5 years. Some types of HPV increase your risk of cervical cancer. Testing for HPV may also be done on women of any age with unclear Pap test results.  Other health care providers may not recommend any screening for nonpregnant women who are considered low risk for pelvic cancer and who do not have symptoms. Ask your health care provider if a screening pelvic exam is right for you.  If you have had past treatment for cervical cancer or a condition that could lead to cancer, you need Pap tests and screening for cancer for at least 20 years after your treatment. If Pap tests have been discontinued, your risk factors (such as having a new sexual partner) need to be reassessed to determine if screening should resume. Some women have medical problems that increase the chance of getting cervical cancer. In these cases, your health care provider may recommend more frequent screening and Pap tests. Colorectal Cancer  This type of cancer can be detected and often prevented.  Routine colorectal cancer screening usually begins at 60 years of age and continues through 60 years of age.  Your health care provider may recommend screening at an earlier age if you have risk factors for colon cancer.  Your health care provider may also recommend using home test kits to check for hidden blood in the stool.  A small camera at the end of a tube can be used to examine your colon directly  (sigmoidoscopy or colonoscopy). This is done to check for the earliest forms of colorectal cancer.  Routine screening usually begins at age 74.  Direct examination of the colon should be repeated every 5-10 years through 60 years of age. However, you may need to be screened more often if early forms of precancerous polyps or small growths are found. Skin Cancer  Check your skin from head to toe regularly.  Tell your health care provider about any new moles or changes in moles, especially if there is a change in a mole's shape or color.  Also tell your health care provider if you have a mole that is larger than the size of a pencil eraser.  Always use sunscreen. Apply sunscreen liberally and repeatedly throughout the day.  Protect yourself by wearing long sleeves, pants, a wide-brimmed hat, and sunglasses whenever you are outside. Heart disease, diabetes, and high blood pressure  High blood pressure causes heart disease and increases the risk of stroke. High blood  pressure is more likely to develop in: ? People who have blood pressure in the high end of the normal range (130-139/85-89 mm Hg). ? People who are overweight or obese. ? People who are African American.  If you are 12-64 years of age, have your blood pressure checked every 3-5 years. If you are 68 years of age or older, have your blood pressure checked every year. You should have your blood pressure measured twice-once when you are at a hospital or clinic, and once when you are not at a hospital or clinic. Record the average of the two measurements. To check your blood pressure when you are not at a hospital or clinic, you can use: ? An automated blood pressure machine at a pharmacy. ? A home blood pressure monitor.  If you are between 68 years and 72 years old, ask your health care provider if you should take aspirin to prevent strokes.  Have regular diabetes screenings. This involves taking a blood sample to check your  fasting blood sugar level. ? If you are at a normal weight and have a low risk for diabetes, have this test once every three years after 60 years of age. ? If you are overweight and have a high risk for diabetes, consider being tested at a younger age or more often. Preventing infection Hepatitis B  If you have a higher risk for hepatitis B, you should be screened for this virus. You are considered at high risk for hepatitis B if: ? You were born in a country where hepatitis B is common. Ask your health care provider which countries are considered high risk. ? Your parents were born in a high-risk country, and you have not been immunized against hepatitis B (hepatitis B vaccine). ? You have HIV or AIDS. ? You use needles to inject street drugs. ? You live with someone who has hepatitis B. ? You have had sex with someone who has hepatitis B. ? You get hemodialysis treatment. ? You take certain medicines for conditions, including cancer, organ transplantation, and autoimmune conditions. Hepatitis C  Blood testing is recommended for: ? Everyone born from [birth year] through 1967. ? Anyone with known risk factors for hepatitis C. Sexually transmitted infections (STIs)  You should be screened for sexually transmitted infections (STIs) including gonorrhea and chlamydia if: ? You are sexually active and are younger than 60 years of age. ? You are older than 60 years of age and your health care provider tells you that you are at risk for this type of infection. ? Your sexual activity has changed since you were last screened and you are at an increased risk for chlamydia or gonorrhea. Ask your health care provider if you are at risk.  If you do not have HIV, but are at risk, it may be recommended that you take a prescription medicine daily to prevent HIV infection. This is called pre-exposure prophylaxis (PrEP). You are considered at risk if: ? You are sexually active and do not regularly use condoms or  know the HIV status of your partner(s). ? You take drugs by injection. ? You are sexually active with a partner who has HIV. Talk with your health care provider about whether you are at high risk of being infected with HIV. If you choose to begin PrEP, you should first be tested for HIV. You should then be tested every 3 months for as long as you are taking PrEP. Pregnancy  If you are premenopausal and you may  become pregnant, ask your health care provider about preconception counseling.  If you may become pregnant, take 400 to 800 micrograms (mcg) of folic acid every day.  If you want to prevent pregnancy, talk to your health care provider about birth control (contraception). Osteoporosis and menopause  Osteoporosis is a disease in which the bones lose minerals and strength with aging. This can result in serious bone fractures. Your risk for osteoporosis can be identified using a bone density scan.  If you are 1 years of age or older, or if you are at risk for osteoporosis and fractures, ask your health care provider if you should be screened.  Ask your health care provider whether you should take a calcium or vitamin D supplement to lower your risk for osteoporosis.  Menopause may have certain physical symptoms and risks.  Hormone replacement therapy may reduce some of these symptoms and risks. Talk to your health care provider about whether hormone replacement therapy is right for you. Follow these instructions at home:  Schedule regular health, dental, and eye exams.  Stay current with your immunizations.  Do not use any tobacco products including cigarettes, chewing tobacco, or electronic cigarettes.  If you are pregnant, do not drink alcohol.  If you are breastfeeding, limit how much and how often you drink alcohol.  Limit alcohol intake to no more than 1 drink per day for nonpregnant women. One drink equals 12 ounces of beer, 5 ounces of wine, or 1 ounces of hard  liquor.  Do not use street drugs.  Do not share needles.  Ask your health care provider for help if you need support or information about quitting drugs.  Tell your health care provider if you often feel depressed.  Tell your health care provider if you have ever been abused or do not feel safe at home. This information is not intended to replace advice given to you by your health care provider. Make sure you discuss any questions you have with your health care provider. Document Released: 09/18/2010 Document Revised: 08/11/2015 Document Reviewed: 12/07/2014 Elsevier Interactive Patient Education  2019 [employer].

## 2018-08-05 NOTE — Progress Notes (Addendum)
Sheila Patrick is a 58 y.o. female presents to office today for annual physical exam examination.    Concerns today include:  Occupation: currently looking for work, Substance use: none Diet: balanced, Exercise: exercises regularly Last eye exam: needs (being seen in [place]) Last dental exam: UTD Last colonoscopy: Due, needs referral to [facility] Last mammogram: UTD (gets done w/ GYN) Last pap smear: hx hysterectomy Refills needed today: none   Past Medical History:  Diagnosis Date  . ARTHRITIS, CERVICAL SPINE 04/11/2009   Qualifier: Diagnosis of  By: Aline Brochure MD, Dorothyann Peng    . Back pain   . Neck pain   . Shoulder pain   . Vaginal pain 04/10/2017   Social History   Socioeconomic History  . Marital status: Single    Spouse name: Not on file  . Number of children: 0  . Years of education: Not on file  . Highest education level: Not on file  Occupational History  . Occupation: Chartered certified accountant  Social Needs  . Financial resource strain: Not on file  . Food insecurity:    Worry: Not on file    Inability: Not on file  . Transportation needs:    Medical: Not on file    Non-medical: Not on file  Tobacco Use  . Smoking status: Never Smoker  . Smokeless tobacco: Never Used  Substance and Sexual Activity  . Alcohol use: Yes    Comment: occas  . Drug use: No  . Sexual activity: Never    Comment: single  Lifestyle  . Physical activity:    Days per week: Not on file    Minutes per session: Not on file  . Stress: Not on file  Relationships  . Social connections:    Talks on phone: Not on file    Gets together: Not on file    Attends religious service: Not on file    Active member of club or organization: Not on file    Attends meetings of clubs or organizations: Not on file    Relationship status: Not on file  . Intimate partner violence:    Fear of current or ex partner: Not on file    Emotionally abused: Not on file    Physically abused: Not on file   Forced sexual activity: Not on file  Other Topics Concern  . Not on file  Social History Narrative  . Not on file   Past Surgical History:  Procedure Laterality Date  . ABDOMINAL HYSTERECTOMY     Total abdominal  . NASAL SINUS SURGERY  1994  . right breast lumpectomy  2002  . VESICOVAGINAL FISTULA CLOSURE W/ TAH  1998   Family History  Problem Relation Age of Onset  . Asthma Mother   . Cancer Father        lung  . Lung cancer Father   . Asthma Sister   . Hypertension Sister   . Cancer Brother        ?laryngeal  . COPD Sister   . Heart disease Sister   . Hyperlipidemia Sister   . Hypertension Sister   . Diabetes Sister   . Heart attack Sister 10  . Cirrhosis Brother   . Alcohol abuse Brother     Current Outpatient Medications:  .  cetirizine (ZYRTEC) 10 MG chewable tablet, Chew 10 mg by mouth daily., Disp: , Rfl:  .  cholecalciferol (VITAMIN D) 1000 units tablet, Take 1,000 Units by mouth daily., Disp: , Rfl:  No Known Allergies   ROS: Review of Systems Constitutional: negative Eyes: positive for left eye twitching Ears, nose, mouth, throat, and face: negative Respiratory: negative Cardiovascular: negative Gastrointestinal: negative Genitourinary:negative Integument/breast: nipple deformity on left (had mammo, being monitored by OBGYN) Hematologic/lymphatic: negative Musculoskeletal:negative Neurological: negative Behavioral/Psych: negative Endocrine: negative Allergic/Immunologic: negative    Physical exam BP 125/73   Pulse 68   Temp 98 F (36.7 C) (Oral)   Ht '5\' 5"'  (1.651 m)   Wt 111 lb (50.3 kg)   BMI 18.47 kg/m  General appearance: alert, cooperative, appears stated age and no distress Head: Normocephalic, without obvious abnormality, atraumatic Eyes: negative findings: lids and lashes normal, conjunctivae and sclerae normal, corneas clear and pupils equal, round, reactive to light and accomodation Ears: normal TM's and external ear canals both  ears Nose: normal. no drainage Throat: lips, mucosa, and tongue normal; teeth and gums normal Neck: no adenopathy, no carotid bruit, no JVD, supple, symmetrical, trachea midline and thyroid not enlarged, symmetric, no tenderness/mass/nodules Back: symmetric, no curvature. ROM normal. No CVA tenderness. Lungs: clear to auscultation bilaterally Heart: regular rate and rhythm, S1, S2 normal, no murmur, click, rub or gallop Abdomen: soft, non-tender; bowel sounds normal; no masses,  no organomegaly Extremities: extremities normal, atraumatic, no cyanosis or edema Pulses: 2+ and symmetric Skin: Skin color, texture, turgor normal. No rashes or lesions Lymph nodes: Cervical, supraclavicular, and axillary nodes normal. Neurologic: Alert and oriented X 3, normal strength and tone. Normal symmetric reflexes. Normal coordination and gait Psych: Mood stable, speech normal, affect appropriate, pleasant Depression screen [facility] 2/9 08/05/2018 03/25/2018 12/10/2017  Decreased Interest 0 0 0  Down, Depressed, Hopeless 0 0 0  PHQ - 2 Score 0 0 0  Altered sleeping 0 - -  Tired, decreased energy 0 - -  Change in appetite 0 - -  Feeling bad or failure about yourself  0 - -  Trouble concentrating 0 - -  Moving slowly or fidgety/restless 0 - -  Suicidal thoughts 0 - -  PHQ-9 Score 0 - -    Assessment/ Plan: Sheila Patrick here for annual physical exam.   1. Annual physical exam Declined tetanus  2. Osteopenia after menopause Plan for DEXA in June.  Will contact patient for an appointment.  Check labs as below. - DG [facility] DEXA; Future - CMP14+EGFR - TSH - CBC - VITAMIN D 25 Hydroxy (Vit-D Deficiency, Fractures) - Urinalysis  3. Screening, lipid - Lipid Panel  4. Tetanus, diphtheria, and acellular pertussis (Tdap) vaccination declined  5. Screening for malignant neoplasm of colon Referred to [facility]. - Ambulatory referral to Gastroenterology   Handout provided on healthy lifestyle choices,  including diet (rich in fruits, vegetables and lean meats and low in salt and simple carbohydrates) and exercise (at least 30 minutes of moderate physical activity daily).  Patient to follow up in 1 year for annual exam or sooner if needed.  Sheila Patrick M. Lajuana Ripple, DO

## 2018-08-05 NOTE — Addendum Note (Signed)
Addended by: Raliegh Ip on: 08/05/2018 03:01 PM   Modules accepted: Orders

## 2018-08-06 ENCOUNTER — Encounter (INDEPENDENT_AMBULATORY_CARE_PROVIDER_SITE_OTHER): Payer: Self-pay | Admitting: *Deleted

## 2018-08-06 LAB — CMP14+EGFR
ALT: 14 IU/L (ref 0–32)
AST: 17 IU/L (ref 0–40)
Albumin/Globulin Ratio: 2.1 (ref 1.2–2.2)
Albumin: 4.6 g/dL (ref 3.8–4.9)
Alkaline Phosphatase: 63 IU/L (ref 39–117)
BUN/Creatinine Ratio: 17 (ref 9–23)
BUN: 13 mg/dL (ref 6–24)
Bilirubin Total: 0.7 mg/dL (ref 0.0–1.2)
CO2: 22 mmol/L (ref 20–29)
Calcium: 9.9 mg/dL (ref 8.7–10.2)
Chloride: 104 mmol/L (ref 96–106)
Creatinine, Ser: 0.75 mg/dL (ref 0.57–1.00)
GFR calc Af Amer: 101 mL/min/{1.73_m2} (ref >59)
GFR calc non Af Amer: 88 mL/min/{1.73_m2} (ref >59)
Globulin, Total: 2.2 g/dL (ref 1.5–4.5)
Glucose: 95 mg/dL (ref 65–99)
Potassium: 4.2 mmol/L (ref 3.5–5.2)
Sodium: 141 mmol/L (ref 134–144)
Total Protein: 6.8 g/dL (ref 6.0–8.5)

## 2018-08-06 LAB — CBC
Hematocrit: 37 % (ref 34.0–46.6)
Hemoglobin: 12.5 g/dL (ref 11.1–15.9)
MCH: 30.1 pg (ref 26.6–33.0)
MCHC: 33.8 g/dL (ref 31.5–35.7)
MCV: 89 fL (ref 79–97)
Platelets: 279 10*3/uL (ref 150–450)
RBC: 4.15 x10E6/uL (ref 3.77–5.28)
RDW: 13.5 % (ref 11.7–15.4)
WBC: 4.2 10*3/uL (ref 3.4–10.8)

## 2018-08-06 LAB — TSH: TSH: 1.59 u[IU]/mL (ref 0.450–4.500)

## 2018-08-06 LAB — VITAMIN D 25 HYDROXY (VIT D DEFICIENCY, FRACTURES): Vit D, 25-Hydroxy: 81.4 ng/mL (ref 30.0–100.0)

## 2018-08-06 LAB — LIPID PANEL
Chol/HDL Ratio: 2.8 ratio (ref 0.0–4.4)
Cholesterol, Total: 238 mg/dL — ABNORMAL HIGH (ref 100–199)
HDL: 86 mg/dL (ref >39)
LDL Calculated: 143 mg/dL — ABNORMAL HIGH (ref 0–99)
Triglycerides: 43 mg/dL (ref 0–149)
VLDL Cholesterol Cal: 9 mg/dL (ref 5–40)

## 2018-08-08 LAB — URINE CULTURE

## 2018-09-01 ENCOUNTER — Other Ambulatory Visit: Payer: Self-pay

## 2018-09-02 ENCOUNTER — Ambulatory Visit (INDEPENDENT_AMBULATORY_CARE_PROVIDER_SITE_OTHER): Payer: 59 | Admitting: Family Medicine

## 2018-09-02 ENCOUNTER — Other Ambulatory Visit: Payer: Self-pay | Admitting: Family Medicine

## 2018-09-02 ENCOUNTER — Encounter: Payer: Self-pay | Admitting: Family Medicine

## 2018-09-02 VITALS — BP 130/69 | HR 69 | Temp 97.7°F | Ht 65.0 in | Wt 112.0 lb

## 2018-09-02 DIAGNOSIS — R3121 Asymptomatic microscopic hematuria: Secondary | ICD-10-CM

## 2018-09-02 DIAGNOSIS — N6459 Other signs and symptoms in breast: Secondary | ICD-10-CM

## 2018-09-02 DIAGNOSIS — N951 Menopausal and female climacteric states: Secondary | ICD-10-CM

## 2018-09-02 DIAGNOSIS — Z01411 Encounter for gynecological examination (general) (routine) with abnormal findings: Secondary | ICD-10-CM | POA: Diagnosis not present

## 2018-09-02 DIAGNOSIS — Z90710 Acquired absence of both cervix and uterus: Secondary | ICD-10-CM | POA: Diagnosis not present

## 2018-09-02 DIAGNOSIS — Z01419 Encounter for gynecological examination (general) (routine) without abnormal findings: Secondary | ICD-10-CM

## 2018-09-02 LAB — URINALYSIS
Bilirubin, UA: NEGATIVE
Glucose, UA: NEGATIVE
Ketones, UA: NEGATIVE
Leukocytes,UA: NEGATIVE
Nitrite, UA: NEGATIVE
Protein,UA: NEGATIVE
Specific Gravity, UA: 1.01 (ref 1.005–1.030)
Urobilinogen, Ur: 0.2 mg/dL (ref 0.2–1.0)
pH, UA: 7 (ref 5.0–7.5)

## 2018-09-02 LAB — WET PREP FOR TRICH, YEAST, CLUE
Clue Cell Exam: NEGATIVE
Trichomonas Exam: NEGATIVE
Yeast Exam: NEGATIVE

## 2018-09-02 NOTE — Patient Instructions (Signed)
You had labs performed today.  You will be contacted with the results of the labs once they are available, usually in the next 3 business days for routine lab work.  If you had a pap smear or biopsy performed, expect to be contacted in about 7-10 days.   Atrophic Vaginitis Atrophic vaginitis is a condition in which the tissues that line the vagina become dry and thin. This condition occurs in women who have stopped having their period. It is caused by a drop in a female hormone (estrogen). This hormone helps:  To keep the vagina moist.  To make a clear fluid. This clear fluid helps: ? To make the vagina ready for sex. ? To protect the vagina from infection. If the lining of the vagina is dry and thin, it may cause irritation, burning, or itchiness. It may also:  Make sex painful.  Make an exam of your vagina painful.  Cause bleeding.  Make you lose interest in sex.  Cause a burning feeling when you pee (urinate).  Cause a brown or yellow fluid to come from your vagina. Some women do not have symptoms. Follow these instructions at home: Medicines  Take over-the-counter and prescription medicines only as told by your doctor.  Do not use herbs or other medicines unless your doctor says it is okay.  Use medicines for for dryness. These include: ? Oils to make the vagina soft. ? Creams. ? Moisturizers. General instructions  Do not douche.  Do not use products that can make your vagina dry. These include: ? Scented sprays. ? Scented tampons. ? Scented soaps.  Sex can help increase blood flow and soften the tissue in the vagina. If it hurts to have sex: ? Tell your partner. ? Use products to make sex more comfortable. Use these only as told by your doctor. Contact a doctor if you:  Have discharge from the vagina that is different than usual.  Have a bad smell coming from your vagina.  Have new symptoms.  Do not get better.  Get worse. Summary  Atrophic vaginitis  is a condition in which the lining of the vagina becomes dry and thin.  This condition affects women who have stopped having their periods.  Treatment may include using products that help make the vagina soft.  Call a doctor if do not get better with treatment. This information is not intended to replace advice given to you by your health care provider. Make sure you discuss any questions you have with your health care provider. Document Released: 08/22/2007 Document Revised: 03/18/2017 Document Reviewed: 03/18/2017 Elsevier Interactive Patient Education  2019 [employer].

## 2018-09-02 NOTE — Progress Notes (Signed)
Subjective: CC: pelvic exam PCP: Sheila Patrick,  M, DO ZOX:[id number]:Sheila Patrick is a 58 y.o. female presenting to clinic today for:  Patient here for routine pelvic and breast exam.  She notes that she was unable to secure an appointment with her gynecologist Dr. Senaida Oresichardson for the next several months and therefore elected to come in here instead.  She denies any abnormal vaginal discharge, pain or bleeding.  She does report vaginal dryness but she uses natural oils to help with this.  She is not sexually active.  She does report chronic history of left side nipple inversion.  She is had normal mammograms and evaluation for this.  It was determined to be benign but is being monitored yearly.   ROS: Per HPI  No Known Allergies Past Medical History:  Diagnosis Date  . ARTHRITIS, CERVICAL SPINE 04/11/2009   Qualifier: Diagnosis of  By: Romeo AppleHarrison MD, Duffy RhodyStanley    . Back pain   . Neck pain   . Shoulder pain   . Vaginal pain 04/10/2017    Current Outpatient Medications:  .  cetirizine (ZYRTEC) 10 MG chewable tablet, Chew 10 mg by mouth daily., Disp: , Rfl:  .  cholecalciferol (VITAMIN D) 1000 units tablet, Take 1,000 Units by mouth daily., Disp: , Rfl:  Social History   Socioeconomic History  . Marital status: Single    Spouse name: Not on file  . Number of children: 0  . Years of education: Not on file  . Highest education level: Not on file  Occupational History  . Occupation: Materials engineeradministrative assitant  Social Needs  . Financial resource strain: Not on file  . Food insecurity    Worry: Not on file    Inability: Not on file  . Transportation needs    Medical: Not on file    Non-medical: Not on file  Tobacco Use  . Smoking status: Never Smoker  . Smokeless tobacco: Never Used  Substance and Sexual Activity  . Alcohol use: Yes    Comment: occas  . Drug use: No  . Sexual activity: Not Currently    Comment: single  Lifestyle  . Physical activity    Days per week: Not on file     Minutes per session: Not on file  . Stress: Not on file  Relationships  . Social Musicianconnections    Talks on phone: Not on file    Gets together: Not on file    Attends religious service: Not on file    Active member of club or organization: Not on file    Attends meetings of clubs or organizations: Not on file    Relationship status: Not on file  . Intimate partner violence    Fear of current or ex partner: Not on file    Emotionally abused: Not on file    Physically abused: Not on file    Forced sexual activity: Not on file  Other Topics Concern  . Not on file  Social History Narrative  . Not on file   Family History  Problem Relation Age of Onset  . Asthma Mother   . Cancer Father        lung  . Lung cancer Father   . Asthma Sister   . Hypertension Sister   . Cancer Brother        ?laryngeal  . COPD Sister   . Heart disease Sister   . Hyperlipidemia Sister   . Hypertension Sister   . Diabetes Sister   .  Heart attack Sister 36  . Cirrhosis Brother   . Alcohol abuse Brother     Objective: Office vital signs reviewed. BP 130/69   Pulse 69   Temp 97.7 F (36.5 C) (Oral)   Ht 5\' 5"  (1.651 m)   Wt 112 lb (50.8 kg)   BMI 18.64 kg/m   Physical Examination:  General: Awake, alert, well nourished, No acute distress Breast: Mildly inverted left nipple noted.  Breasts are symmetric.  No dominant masses appreciated on manual exam.  No nipple discharge. Lymph nodes: No enlarged supraclavicular or axillary lymph nodes palpated. GI: soft, non-tender, non-distended, bowel sounds present x4, no hepatomegaly, no splenomegaly, no masses GU: external vaginal tissue atrophic, small skin tag noted along the base of the right labia majora, cervix surgically absent, no vaginal bleeding or discharge.  Assessment/ Plan: 58 y.o. female   1. Normal vaginal exam Vaginal atrophy noted but patient is postmenopausal, status post hysterectomy.  Cervical cuff was absent on exam. - WET  PREP FOR [place], YEAST, CLUE  2. Menopausal vaginal dryness Okay to continue natural pathic forms of vaginal lubrication - WET PREP FOR [place], YEAST, CLUE  3. History of hysterectomy Will forward records to her gynecologist - Sheila Patrick, Sheila Patrick, CLUE  4. Asymptomatic microscopic hematuria Of uncertain etiology.  Recheck urine dip - Urinalysis  5. Inversion of nipple Chronic and stable   Orders Placed This Encounter  Procedures  . WET PREP FOR [place], YEAST, CLUE  . Urinalysis   No orders of the defined types were placed in this encounter.    [name], DO [place] [phone]

## 2018-09-12 ENCOUNTER — Telehealth: Payer: Self-pay | Admitting: Family Medicine

## 2018-09-12 NOTE — Telephone Encounter (Signed)
Spoke with [facility], it is in review and they will be calling her to schedule.  Because of Covid, they are still operating on a reduced schedule.

## 2018-09-12 NOTE — Telephone Encounter (Signed)
Spoke to patient and she will call to schedule

## 2018-09-12 NOTE — Telephone Encounter (Signed)
Patient aware of results.

## 2018-10-17 ENCOUNTER — Other Ambulatory Visit: Payer: Self-pay

## 2018-10-17 ENCOUNTER — Other Ambulatory Visit (HOSPITAL_COMMUNITY)
Admission: RE | Admit: 2018-10-17 | Discharge: 2018-10-17 | Disposition: A | Discharge status: Home / Self Care | Payer: 59 | Source: Ambulatory Visit | Attending: Urology | Admitting: Urology

## 2018-10-17 ENCOUNTER — Ambulatory Visit (INDEPENDENT_AMBULATORY_CARE_PROVIDER_SITE_OTHER): Payer: 59 | Admitting: Urology

## 2018-10-17 DIAGNOSIS — R8271 Bacteriuria: Secondary | ICD-10-CM | POA: Diagnosis not present

## 2018-10-17 DIAGNOSIS — R3121 Asymptomatic microscopic hematuria: Secondary | ICD-10-CM

## 2018-10-17 LAB — URINALYSIS, COMPLETE (UACMP) WITH MICROSCOPIC
Bacteria, UA: NONE SEEN
Bilirubin Urine: NEGATIVE
Glucose, UA: NEGATIVE mg/dL
Ketones, ur: NEGATIVE mg/dL
Leukocytes,Ua: NEGATIVE
Nitrite: NEGATIVE
Protein, ur: NEGATIVE mg/dL
Specific Gravity, Urine: 1.011 (ref 1.005–1.030)
pH: 7 (ref 5.0–8.0)

## 2018-10-18 LAB — URINE CULTURE: Culture: NO GROWTH

## 2018-10-20 ENCOUNTER — Telehealth: Payer: Self-pay | Admitting: Family Medicine

## 2018-10-21 NOTE — Telephone Encounter (Signed)
Fax to [employer]

## 2018-10-28 ENCOUNTER — Other Ambulatory Visit: Payer: Self-pay | Admitting: Obstetrics and Gynecology

## 2018-10-28 ENCOUNTER — Other Ambulatory Visit (HOSPITAL_COMMUNITY)
Admission: RE | Admit: 2018-10-28 | Discharge: 2018-10-28 | Disposition: A | Discharge status: Home / Self Care | Payer: 59 | Source: Ambulatory Visit | Attending: Urology | Admitting: Urology

## 2018-10-28 DIAGNOSIS — R928 Other abnormal and inconclusive findings on diagnostic imaging of breast: Secondary | ICD-10-CM

## 2018-10-28 DIAGNOSIS — R3121 Asymptomatic microscopic hematuria: Secondary | ICD-10-CM | POA: Insufficient documentation

## 2018-10-28 LAB — URINALYSIS, COMPLETE (UACMP) WITH MICROSCOPIC
Bacteria, UA: NONE SEEN
Bilirubin Urine: NEGATIVE
Glucose, UA: NEGATIVE mg/dL
Ketones, ur: NEGATIVE mg/dL
Leukocytes,Ua: NEGATIVE
Nitrite: NEGATIVE
Protein, ur: NEGATIVE mg/dL
Specific Gravity, Urine: 1.013 (ref 1.005–1.030)
pH: 6 (ref 5.0–8.0)

## 2018-10-30 ENCOUNTER — Ambulatory Visit
Admission: RE | Admit: 2018-10-30 | Discharge: 2018-10-30 | Disposition: A | Discharge status: Home / Self Care | Payer: 59 | Source: Ambulatory Visit | Attending: Obstetrics and Gynecology | Admitting: Obstetrics and Gynecology

## 2018-10-30 ENCOUNTER — Other Ambulatory Visit: Payer: Self-pay | Admitting: Obstetrics and Gynecology

## 2018-10-30 ENCOUNTER — Other Ambulatory Visit: Payer: Self-pay

## 2018-10-30 DIAGNOSIS — R928 Other abnormal and inconclusive findings on diagnostic imaging of breast: Secondary | ICD-10-CM

## 2018-11-10 ENCOUNTER — Other Ambulatory Visit (INDEPENDENT_AMBULATORY_CARE_PROVIDER_SITE_OTHER): Payer: Self-pay | Admitting: *Deleted

## 2018-11-10 DIAGNOSIS — Z1211 Encounter for screening for malignant neoplasm of colon: Secondary | ICD-10-CM

## 2018-11-23 ENCOUNTER — Encounter (HOSPITAL_COMMUNITY): Payer: Self-pay

## 2018-11-23 ENCOUNTER — Emergency Department (HOSPITAL_COMMUNITY)
Admission: EM | Admit: 2018-11-23 | Discharge: 2018-11-23 | Disposition: A | Discharge status: Home / Self Care | Payer: 59 | Attending: Emergency Medicine | Admitting: Emergency Medicine

## 2018-11-23 ENCOUNTER — Other Ambulatory Visit: Payer: Self-pay

## 2018-11-23 DIAGNOSIS — Y939 Activity, unspecified: Secondary | ICD-10-CM | POA: Diagnosis not present

## 2018-11-23 DIAGNOSIS — Y999 Unspecified external cause status: Secondary | ICD-10-CM | POA: Diagnosis not present

## 2018-11-23 DIAGNOSIS — X088XXA Exposure to other specified smoke, fire and flames, initial encounter: Secondary | ICD-10-CM | POA: Insufficient documentation

## 2018-11-23 DIAGNOSIS — S8011XA Contusion of right lower leg, initial encounter: Secondary | ICD-10-CM | POA: Insufficient documentation

## 2018-11-23 DIAGNOSIS — T23062A Burn of unspecified degree of back of left hand, initial encounter: Secondary | ICD-10-CM | POA: Insufficient documentation

## 2018-11-23 DIAGNOSIS — S8012XA Contusion of left lower leg, initial encounter: Secondary | ICD-10-CM | POA: Diagnosis not present

## 2018-11-23 DIAGNOSIS — Z79899 Other long term (current) drug therapy: Secondary | ICD-10-CM | POA: Insufficient documentation

## 2018-11-23 DIAGNOSIS — Y929 Unspecified place or not applicable: Secondary | ICD-10-CM | POA: Diagnosis not present

## 2018-11-23 DIAGNOSIS — S80921A Unspecified superficial injury of right lower leg, initial encounter: Secondary | ICD-10-CM | POA: Diagnosis present

## 2018-11-23 MED ORDER — METHOCARBAMOL 500 MG PO TABS: 500.0000 mg | ORAL_TABLET | Freq: Every evening | ORAL | 0 refills | Status: DC | PRN
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
## 2018-11-23 NOTE — Discharge Instructions (Signed)
Take ibuprofen 3 times a day with meals.  Do not take other anti-inflammatories at the same time (Advil, Motrin, naproxen, Aleve). You may supplement with Tylenol if you need further pain control. °Use robaxin as needed for muscle stiffness or soreness.  Have caution, this may make you tired or groggy.  Do not drive or operate heavy machinery while taking this medicine. °Use ice packs or heating pads if this helps control your pain. °You will likely have continued muscle stiffness and soreness over the next couple days.  Follow-up with primary care in 1 week if your symptoms are not improving. °Return to the emergency room if you develop vision changes, vomiting, slurred speech, numbness, loss of bowel or bladder control, or any new or worsening symptoms. ° °
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
## 2018-11-25 ENCOUNTER — Encounter: Payer: Self-pay | Admitting: Family Medicine

## 2018-11-25 ENCOUNTER — Other Ambulatory Visit: Payer: Self-pay

## 2018-11-25 ENCOUNTER — Ambulatory Visit (INDEPENDENT_AMBULATORY_CARE_PROVIDER_SITE_OTHER): Payer: 59 | Admitting: Family Medicine

## 2018-11-25 VITALS — BP 138/77 | HR 83 | Temp 98.2°F | Ht 63.0 in | Wt 107.0 lb

## 2018-11-25 DIAGNOSIS — M62838 Other muscle spasm: Secondary | ICD-10-CM

## 2018-11-25 DIAGNOSIS — M25552 Pain in left hip: Secondary | ICD-10-CM
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
## 2018-11-25 NOTE — Patient Instructions (Signed)
Take the muscle relaxer at bedtime.  This should help you sleep.  Ok to start gentle neck stretches but hold off on back/ hip stuff for a few days

## 2018-12-19 ENCOUNTER — Telehealth (INDEPENDENT_AMBULATORY_CARE_PROVIDER_SITE_OTHER): Payer: Self-pay | Admitting: *Deleted

## 2018-12-19 ENCOUNTER — Encounter (INDEPENDENT_AMBULATORY_CARE_PROVIDER_SITE_OTHER): Payer: Self-pay | Admitting: *Deleted

## 2018-12-19 DIAGNOSIS — Z1211 Encounter for screening for malignant neoplasm of colon: Secondary | ICD-10-CM

## 2018-12-19 MED ORDER — SUPREP BOWEL PREP KIT 17.5-3.13-1.6 GM/177ML PO SOLN: 1.0000 | Freq: Once | ORAL | 0 refills | Status: AC

## 2018-12-19 NOTE — Telephone Encounter (Signed)
Patient needs suprep TCS sch'd 1/14 

## 2018-12-24 ENCOUNTER — Ambulatory Visit (INDEPENDENT_AMBULATORY_CARE_PROVIDER_SITE_OTHER): Payer: Self-pay

## 2018-12-24 ENCOUNTER — Other Ambulatory Visit: Payer: Self-pay

## 2018-12-24 ENCOUNTER — Telehealth (INDEPENDENT_AMBULATORY_CARE_PROVIDER_SITE_OTHER): Payer: Self-pay | Admitting: *Deleted

## 2018-12-24 NOTE — Telephone Encounter (Signed)
Referring MD/PCP: gottschalk   Procedure: tcs  Reason/Indication:  screening  Has patient had this procedure before?  Yes, 2010  If so, when, by whom and where?    Is there a family history of colon cancer?  no  Who?  What age when diagnosed?    Is patient diabetic?   no      Does patient have prosthetic heart valve or mechanical valve?  no  Do you have a pacemaker/defibrillator?  no  Has patient ever had endocarditis/atrial fibrillation? no  Does patient use oxygen? no  Has patient had joint replacement within last 12 months?  no  Is patient constipated or do they take laxatives? no  Does patient have a history of alcohol/drug use?  no  Is patient on blood thinner such as Coumadin, Plavix and/or Aspirin? no  Medications: zyrtec seasonal, vit d3 daily, potassium daily  Allergies: nkda  Medication Adjustment per Dr Charlena Cross, NP:   Procedure date & time: 01/21/19 at 930

## 2019-01-19 ENCOUNTER — Other Ambulatory Visit (HOSPITAL_COMMUNITY)
Admission: RE | Admit: 2019-01-19 | Discharge: 2019-01-19 | Disposition: A | Discharge status: Home / Self Care | Payer: BC Managed Care – PPO | Source: Ambulatory Visit | Attending: Internal Medicine | Admitting: Internal Medicine

## 2019-01-19 ENCOUNTER — Other Ambulatory Visit: Payer: Self-pay

## 2019-01-19 DIAGNOSIS — Z01812 Encounter for preprocedural laboratory examination: Secondary | ICD-10-CM | POA: Diagnosis present

## 2019-01-19 DIAGNOSIS — Z20828 Contact with and (suspected) exposure to other viral communicable diseases: Secondary | ICD-10-CM | POA: Diagnosis not present

## 2019-01-19 LAB — SARS CORONAVIRUS 2 (TAT 6-24 HRS): SARS Coronavirus 2: NEGATIVE

## 2019-01-21 ENCOUNTER — Ambulatory Visit (HOSPITAL_COMMUNITY)
Admission: RE | Admit: 2019-01-21 | Discharge: 2019-01-21 | Disposition: A | Discharge status: Home / Self Care | Payer: BC Managed Care – PPO | Attending: Internal Medicine | Admitting: Internal Medicine

## 2019-01-21 ENCOUNTER — Other Ambulatory Visit: Payer: Self-pay

## 2019-01-21 ENCOUNTER — Encounter (HOSPITAL_COMMUNITY)
Admission: RE | Disposition: A | Discharge status: Home / Self Care | Payer: Self-pay | Source: Home / Self Care | Attending: Internal Medicine

## 2019-01-21 ENCOUNTER — Encounter (HOSPITAL_COMMUNITY): Payer: Self-pay | Admitting: *Deleted

## 2019-01-21 DIAGNOSIS — Z79899 Other long term (current) drug therapy: Secondary | ICD-10-CM | POA: Insufficient documentation

## 2019-01-21 DIAGNOSIS — M47812 Spondylosis without myelopathy or radiculopathy, cervical region: Secondary | ICD-10-CM | POA: Diagnosis not present

## 2019-01-21 DIAGNOSIS — K644 Residual hemorrhoidal skin tags: Secondary | ICD-10-CM | POA: Insufficient documentation

## 2019-01-21 DIAGNOSIS — K6289 Other specified diseases of anus and rectum: Secondary | ICD-10-CM | POA: Insufficient documentation

## 2019-01-21 DIAGNOSIS — Z1211 Encounter for screening for malignant neoplasm of colon: Secondary | ICD-10-CM | POA: Diagnosis not present

## 2019-01-21 HISTORY — DX: Nausea with vomiting, unspecified: R11.2

## 2019-01-21 HISTORY — DX: Other specified postprocedural states: Z98.890

## 2019-01-21 HISTORY — PX: COLONOSCOPY: SHX5424

## 2019-01-21 SURGERY — COLONOSCOPY
Anesthesia: Moderate Sedation

## 2019-01-21 MED ORDER — ONDANSETRON HCL 4 MG/2ML IJ SOLN
INTRAMUSCULAR | Status: AC
  Filled 2019-01-21: qty 2

## 2019-01-21 MED ORDER — MEPERIDINE HCL 50 MG/ML IJ SOLN
INTRAMUSCULAR | Status: DC | PRN
  Administered 2019-01-21: 20 mg via INTRAVENOUS
  Administered 2019-01-21: 15 mg via INTRAVENOUS

## 2019-01-21 MED ORDER — MIDAZOLAM HCL 5 MG/5ML IJ SOLN
INTRAMUSCULAR | Status: AC
  Filled 2019-01-21: qty 10

## 2019-01-21 MED ORDER — SODIUM CHLORIDE 0.9 % IV SOLN
INTRAVENOUS | Status: DC
  Administered 2019-01-21: 1000 mL via INTRAVENOUS

## 2019-01-21 MED ORDER — STERILE WATER FOR IRRIGATION IR SOLN
Status: DC | PRN
  Administered 2019-01-21: 4 mL

## 2019-01-21 MED ORDER — MEPERIDINE HCL 50 MG/ML IJ SOLN
INTRAMUSCULAR | Status: AC
  Filled 2019-01-21: qty 1

## 2019-01-21 MED ORDER — MIDAZOLAM HCL 5 MG/5ML IJ SOLN
INTRAMUSCULAR | Status: DC | PRN
  Administered 2019-01-21 (×2): 2 mg via INTRAVENOUS

## 2019-01-21 NOTE — H&P (Signed)
Sheila Patrick is an 58 y.o. female.   Chief Complaint: Patient is here for colonoscopy. HPI: Patient is 58 year old female who is here for screening colonoscopy.  She denies abdominal pain change in bowel habits or rectal bleeding.  Last colonoscopy was normal 10 years ago. Family history is negative for CRC.  Past Medical History:  Diagnosis Date  . ARTHRITIS, CERVICAL SPINE 04/11/2009   Qualifier: Diagnosis of  By: Aline Brochure MD, Dorothyann Peng    . Back pain   . Neck pain   . PONV (postoperative nausea and vomiting)   . Shoulder pain   . Vaginal pain 04/10/2017    Past Surgical History:  Procedure Laterality Date  . ABDOMINAL HYSTERECTOMY     Total abdominal  . NASAL SINUS SURGERY  1994  . right breast lumpectomy  2002  . VESICOVAGINAL FISTULA CLOSURE W/ TAH  1998    Family History  Problem Relation Age of Onset  . Asthma Mother   . Cancer Father        lung  . Lung cancer Father   . Asthma Sister   . Hypertension Sister   . Cancer Brother        ?laryngeal  . COPD Sister   . Heart disease Sister   . Hyperlipidemia Sister   . Hypertension Sister   . Diabetes Sister   . Heart attack Sister 36  . Cirrhosis Brother   . Alcohol abuse Brother    Social History:  reports that she has never smoked. She has never used smokeless tobacco. She reports previous alcohol use. She reports that she does not use drugs.  Allergies:  Allergies  Allergen Reactions  . Other Other (See Comments)    Dye (unknown)    Medications Prior to Admission  Medication Sig Dispense Refill  . b complex vitamins tablet Take 1 tablet by mouth every other day.    . cetirizine (ZYRTEC) 10 MG chewable tablet Chew 10 mg by mouth as needed for allergies.     . cholecalciferol (VITAMIN D) 1000 units tablet Take 1,000 Units by mouth every other day. With K2    . omega-3 acid ethyl esters (LOVAZA) 1 g capsule Take 1 g by mouth every other day.    . methocarbamol (ROBAXIN) 500 MG tablet Take 1 tablet (500  mg total) by mouth at bedtime as needed for muscle spasms. (Patient not taking: Reported on 01/14/2019) 10 tablet 0    No results found for this or any previous visit (from the past 48 hour(s)). No results found.  ROS  Blood pressure 119/68, pulse 71, temperature 98.6 F (37 C), temperature source Axillary, resp. rate 17, height 5\' 3"  (1.6 m), weight 49.4 kg, SpO2 100 %. Physical Exam  Constitutional:  Well-developed thin female in NAD.  HENT:  Mouth/Throat: Oropharynx is clear and moist.  Eyes: Conjunctivae are normal. No scleral icterus.  Neck: No thyromegaly present.  Cardiovascular: Normal rate, regular rhythm and normal heart sounds.  No murmur heard. Respiratory: Effort normal and breath sounds normal.  GI:  Abdomen is flaccid Pfannenstiel scar.  It is soft and nontender with organomegaly or masses.  Musculoskeletal:        General: No edema.  Lymphadenopathy:    She has no cervical adenopathy.  Neurological: She is alert.  Skin: Skin is warm and dry.     Assessment/Plan Average risk screening colonoscopy.  Hildred Laser, MD 01/21/2019, 9:19 AM

## 2019-01-21 NOTE — Op Note (Signed)
[facility] Patient Name: Sheila Patrick Procedure Date: 01/21/2019 9:04 AM MRN: [id number] Date of Birth: 1961/03/10 Attending MD: Lionel December , MD CSN: [id number] Age: 58 Admit Type: Outpatient Procedure:                Colonoscopy Indications:              Screening for colorectal malignant neoplasm Providers:                Lionel December, MD, Judee Clara, RN, Pandora Leiter, Technician Referring MD:             Rozell Searing. Gottschalk, DO Medicines:                Meperidine 35 mg IV, Midazolam 4 mg IV Complications:            No immediate complications. Estimated Blood Loss:     Estimated blood loss: none. Procedure:                Pre-Anesthesia Assessment:                           - Prior to the procedure, a History and Physical                            was performed, and patient medications and                            allergies were reviewed. The patient's tolerance of                            previous anesthesia was also reviewed. The risks                            and benefits of the procedure and the sedation                            options and risks were discussed with the patient.                            All questions were answered, and informed consent                            was obtained. Prior Anticoagulants: The patient has                            taken no previous anticoagulant or antiplatelet                            agents. ASA Grade Assessment: I - A normal, healthy                            patient. After reviewing the risks and benefits,  the patient was deemed in satisfactory condition to                            undergo the procedure.                           After obtaining informed consent, the colonoscope                            was passed under direct vision. Throughout the                            procedure, the patient's blood pressure, pulse, and              oxygen saturations were monitored continuously. The                            [id number]-[id number] ([id number] scope was introduced through                            the anus and advanced to the the cecum, identified                            by appendiceal orifice and ileocecal valve. The                            colonoscopy was performed without difficulty. The                            patient tolerated the procedure well. The quality                            of the bowel preparation was excellent. The                            ileocecal valve, appendiceal orifice, and rectum                            were photographed. Scope In: 9:30:41 AM Scope Out: 9:45:57 AM Scope Withdrawal Time: 0 hours 6 minutes 38 seconds  Total Procedure Duration: 0 hours 15 minutes 16 seconds  Findings:      The perianal and digital rectal examinations were normal.      The colon (entire examined portion) appeared normal.      External hemorrhoids were found during retroflexion. The hemorrhoids       were small.      Anal papilla(e) were hypertrophied. Impression:               - The entire examined colon is normal.                           - External hemorrhoids.                           - Anal papilla(e) were hypertrophied.                           -  No specimens collected. Moderate Sedation:      Moderate (conscious) sedation was administered by the endoscopy nurse       and supervised by the endoscopist. The following parameters were       monitored: oxygen saturation, heart rate, blood pressure, CO2       capnography and response to care. Total physician intraservice time was       23 minutes. Recommendation:           - Patient has a contact number available for                            emergencies. The signs and symptoms of potential                            delayed complications were discussed with the                            patient. Return to normal activities tomorrow.                             Written discharge instructions were provided to the                            patient.                           - Resume previous diet today.                           - Continue present medications.                           - Repeat colonoscopy in 10 years for screening                            purposes. Procedure Code(s):        --- Professional ---                           [id number], Colonoscopy, flexible; diagnostic, including                            collection of specimen(s) by brushing or washing,                            when performed (separate procedure)                           99153, Moderate sedation; each additional 15                            minutes intraservice time                           G0500, Moderate sedation services provided by the  same physician or other qualified health care                            professional performing a gastrointestinal                            endoscopic service that sedation supports,                            requiring the presence of an independent trained                            observer to assist in the monitoring of the                            patient's level of consciousness and physiological                            status; initial 15 minutes of intra-service time;                            patient age 67 years or older (additional time may                            be reported with 81191, as appropriate) Diagnosis Code(s):        --- Professional ---                           Z12.11, Encounter for screening for malignant                            neoplasm of colon                           K64.4, Residual hemorrhoidal skin tags                           K62.89, Other specified diseases of anus and rectum CPT copyright 2019 [employer]. All rights reserved. The codes documented in this report are preliminary and upon coder review may  be  revised to meet current compliance requirements. Lionel December, MD Lionel December, MD 01/21/2019 9:52:48 AM This report has been signed electronically. Number of Addenda: 0

## 2019-01-21 NOTE — Discharge Instructions (Signed)
Resume usual medications and diet as before. °No driving for 24 hours. °Next screening exam in 10 years. ° ° °Colonoscopy, Adult, Care After °This sheet gives you information about how to care for yourself after your procedure. Your doctor may also give you more specific instructions. If you have problems or questions, call your doctor. °What can I expect after the procedure? °After the procedure, it is common to have: °· A small amount of blood in your poop for 24 hours. °· Some gas. °· Mild cramping or bloating in your belly. °Follow these instructions at home: °General instructions °· For the first 24 hours after the procedure: °? Do not drive or use machinery. °? Do not sign important documents. °? Do not drink alcohol. °? Do your daily activities more slowly than normal. °? Eat foods that are soft and easy to digest. °· Take over-the-counter or prescription medicines only as told by your doctor. °To help cramping and bloating: ° °· Try walking around. °· Put heat on your belly (abdomen) as told by your doctor. Use a heat source that your doctor recommends, such as a moist heat pack or a heating pad. °? Put a towel between your skin and the heat source. °? Leave the heat on for 20-30 minutes. °? Remove the heat if your skin turns bright red. This is especially important if you cannot feel pain, heat, or cold. You can get burned. °Eating and drinking ° °· Drink enough fluid to keep your pee (urine) clear or pale yellow. °· Return to your normal diet as told by your doctor. Avoid heavy or fried foods that are hard to digest. °· Avoid drinking alcohol for as long as told by your doctor. °Contact a doctor if: °· You have blood in your poop (stool) 2-3 days after the procedure. °Get help right away if: °· You have more than a small amount of blood in your poop. °· You see large clumps of tissue (blood clots) in your poop. °· Your belly is swollen. °· You feel sick to your stomach (nauseous). °· You throw up  (vomit). °· You have a fever. °· You have belly pain that gets worse, and medicine does not help your pain. °Summary °· After the procedure, it is common to have a small amount of blood in your poop. You may also have mild cramping and bloating in your belly. °· For the first 24 hours after the procedure, do not drive or use machinery, do not sign important documents, and do not drink alcohol. °· Get help right away if you have a lot of blood in your poop, feel sick to your stomach, have a fever, or have more belly pain. °This information is not intended to replace advice given to you by your health care provider. Make sure you discuss any questions you have with your health care provider. °Document Released: 04/07/2010 Document Revised: 01/03/2017 Document Reviewed: 11/28/2015 °Elsevier Patient Education © 2020 [employer] ° °Hemorrhoids °Hemorrhoids are swollen veins that may develop: °· In the butt (rectum). These are called internal hemorrhoids. °· Around the opening of the butt (anus). These are called external hemorrhoids. °Hemorrhoids can cause pain, itching, or bleeding. Most of the time, they do not cause serious problems. They usually get better with diet changes, lifestyle changes, and other home treatments. °What are the causes? °This condition may be caused by: °· Having trouble pooping (constipation). °· Pushing hard (straining) to poop. °· Watery poop (diarrhea). °· Pregnancy. °· Being very overweight (  obese). °· Sitting for long periods of time. °· Heavy lifting or other activity that causes you to strain. °· Anal sex. °· Riding a bike for a long period of time. °What are the signs or symptoms? °Symptoms of this condition include: °· Pain. °· Itching or soreness in the butt. °· Bleeding from the butt. °· Leaking poop. °· Swelling in the area. °· One or more lumps around the opening of your butt. °How is this diagnosed? °A doctor can often diagnose this condition by looking at the affected area. The  doctor may also: °· Do an exam that involves feeling the area with a gloved hand (digital rectal exam). °· Examine the area inside your butt using a small tube (anoscope). °· Order blood tests. This may be done if you have lost a lot of blood. °· Have you get a test that involves looking inside the colon using a flexible tube with a camera on the end (sigmoidoscopy or colonoscopy). °How is this treated? °This condition can usually be treated at home. Your doctor may tell you to change what you eat, make lifestyle changes, or try home treatments. If these do not help, procedures can be done to remove the hemorrhoids or make them smaller. These may involve: °· Placing rubber bands at the base of the hemorrhoids to cut off their blood supply. °· Injecting medicine into the hemorrhoids to shrink them. °· Shining a type of light energy onto the hemorrhoids to cause them to fall off. °· Doing surgery to remove the hemorrhoids or cut off their blood supply. °Follow these instructions at home: °Eating and drinking ° °· Eat foods that have a lot of fiber in them. These include whole grains, beans, nuts, fruits, and vegetables. °· Ask your doctor about taking products that have added fiber (fibersupplements). °· Reduce the amount of fat in your diet. You can do this by: °? Eating low-fat dairy products. °? Eating less red meat. °? Avoiding processed foods. °· Drink enough fluid to keep your pee (urine) pale yellow. °Managing pain and swelling ° °· Take a warm-water bath (sitz bath) for 20 minutes to ease pain. Do this 3-4 times a day. You may do this in a bathtub or using a portable sitz bath that fits over the toilet. °· If told, put ice on the painful area. It may be helpful to use ice between your warm baths. °? Put ice in a plastic bag. °? Place a towel between your skin and the bag. °? Leave the ice on for 20 minutes, 2-3 times a day. °General instructions °· Take over-the-counter and prescription medicines only as told  by your doctor. °? Medicated creams and medicines may be used as told. °· Exercise often. Ask your doctor how much and what kind of exercise is best for you. °· Go to the bathroom when you have the urge to poop. Do not wait. °· Avoid pushing too hard when you poop. °· Keep your butt dry and clean. Use wet toilet paper or moist towelettes after pooping. °· Do not sit on the toilet for a long time. °· Keep all follow-up visits as told by your doctor. This is important. °Contact a doctor if you: °· Have pain and swelling that do not get better with treatment or medicine. °· Have trouble pooping. °· Cannot poop. °· Have pain or swelling outside the area of the hemorrhoids. °Get help right away if you have: °· Bleeding that will not stop. °Summary °· Hemorrhoids are   swollen veins in the butt or around the opening of the butt. °· They can cause pain, itching, or bleeding. °· Eat foods that have a lot of fiber in them. These include whole grains, beans, nuts, fruits, and vegetables. °· Take a warm-water bath (sitz bath) for 20 minutes to ease pain. Do this 3-4 times a day. °This information is not intended to replace advice given to you by your health care provider. Make sure you discuss any questions you have with your health care provider. °Document Released: 12/13/2007 Document Revised: 03/13/2018 Document Reviewed: 07/25/2017 °Elsevier Patient Education © 2020 [employer] ° ° ° °

## 2019-01-26 ENCOUNTER — Encounter (HOSPITAL_COMMUNITY): Payer: Self-pay | Admitting: Internal Medicine

## 2019-04-27 ENCOUNTER — Other Ambulatory Visit: Payer: Self-pay

## 2019-04-28 ENCOUNTER — Ambulatory Visit (INDEPENDENT_AMBULATORY_CARE_PROVIDER_SITE_OTHER): Payer: 59 | Admitting: Family Medicine

## 2019-04-28 ENCOUNTER — Encounter: Payer: Self-pay | Admitting: Family Medicine

## 2019-04-28 ENCOUNTER — Other Ambulatory Visit: Payer: Self-pay

## 2019-04-28 VITALS — BP 130/71 | HR 76 | Temp 98.0°F | Ht 63.0 in | Wt 109.0 lb

## 2019-04-28 DIAGNOSIS — R1909 Other intra-abdominal and pelvic swelling, mass and lump: Secondary | ICD-10-CM | POA: Diagnosis not present

## 2019-04-28 NOTE — Patient Instructions (Signed)
I suspect that this is a small femoral hernia.   Femoral Hernia, Adult  Having a femoral hernia means that fat or part of the intestine has pushed through a weak area between muscles into an opening in the lower groin (femoral canal). A femoral hernia may be present at birth, but it may not cause symptoms until you are an adult. You may also develop a femoral hernia as you get older. A femoral hernia tends to get worse over time. If it is not treated, it will not go away. There are several types of femoral hernias. You may have:  A hernia that comes and goes (reducible hernia). You may be able to see it only when you strain, lift something heavy, or cough. This type of hernia can be pushed back into the abdomen (reduced).  A hernia that traps abdominal tissue inside the hernia (incarcerated hernia). This type of hernia cannot be reduced.  A hernia that cuts off blood flow to the tissues inside the hernia (strangulated hernia). Without blood supply, these tissues can start to die. This type of hernia requires emergency treatment. What are the causes? The cause is usually not known. This condition can be triggered by:  Coughing.  Suddenly straining the muscles of the abdomen.  Lifting heavy objects.  Straining to have a bowel movement. Constipation can lead to a femoral hernia. What increases the risk? You have a greater risk for a femoral hernia if you:  Are female.  Frequently lift heavy objects.  Smoke or have lung disease.  Are often constipated.  Strain to pass urine.  Are overweight. What are the signs or symptoms? In many cases, a femoral hernia does not cause symptoms. The most common symptom is a bulge in the upper thigh or groin. In women, the bulge may form on the outside of the vagina instead. Other symptoms may include:  Mild pain or pressure.  Numbness. Symptoms of a strangulated femoral hernia include:  Sharp or increasing pain.  Nausea and  vomiting.  Redness or darkening color of the hernia bulge. How is this diagnosed? This condition is diagnosed based on:  Your symptoms.  Your medical history.  A physical exam. You may be asked to cough or strain while standing. These actions increase the pressure inside your abdomen and force the hernia through the opening in your muscles. Your health care provider may try to reduce the hernia by pressing on it.  Imaging tests, such as: ? Ultrasound. ? CT scan. How is this treated? Surgery is the only treatment for a femoral hernia. A strangulated hernia requires emergency surgery. Follow these instructions at home: Activity  Return to your normal activities as told by your health care provider. Ask your health care provider what activities are safe for you.  Do not lift anything that is heavier than 10 lb (4.5 kg), or the limit that you are told, until your health care provider says that it is safe. Eating and drinking   Follow instructions from your health care provider about eating or drinking restrictions.  Eat more fiber to prevent constipation. Foods that contain fiber include fruits, vegetables, and whole grains.  Drink enough fluid to keep your urine pale yellow. This also helps to prevent constipation. General instructions  Take over-the-counter and prescription medicines only as told by your health care provider.  If you are overweight, work with your health care provider to safely lose weight.  Do not use any products that contain nicotine or tobacco, such  as cigarettes and e-cigarettes. If you need help quitting, ask your health care provider.  Keep all follow-up visits as told by your health care provider. This is important. Contact a health care provider if:  Your hernia becomes uncomfortable.  Your hernia gets larger and you cannot reduce it.  You are constipated. Signs of constipation include: ? Fewer bowel movements in a week than normal. ? Difficulty  having a bowel movement. ? Stools that are dry, hard, or larger than normal.  You strain to pass urine. Get help right away if:  Your hernia suddenly becomes painful.  You have hernia pain that suddenly gets worse.  You have hernia pain along with any of the following: ? Chills. ? Fever. ? Nausea. ? Vomiting.  Your hernia bulge becomes dark, red, or painful to touch. Summary  Having a femoral hernia means that fat or part of the intestine has pushed through a weak area between muscles into an opening in the lower groin (femoral canal).  The most common sign of a femoral hernia is a bulge in the upper thigh or groin. In women, the bulge may form on the outside of the vagina instead.  Surgery is the only treatment for a femoral hernia. If this type of hernia is not treated, it will not go away. This information is not intended to replace advice given to you by your health care provider. Make sure you discuss any questions you have with your health care provider. Document Revised: 02/15/2017 Document Reviewed: 08/09/2016 Elsevier Patient Education  [place].
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
## 2019-05-04 ENCOUNTER — Other Ambulatory Visit: Payer: Self-pay | Admitting: Obstetrics and Gynecology

## 2019-05-04 ENCOUNTER — Other Ambulatory Visit: Payer: Self-pay

## 2019-05-04 ENCOUNTER — Ambulatory Visit
Admission: RE | Admit: 2019-05-04 | Discharge: 2019-05-04 | Disposition: A | Discharge status: Home / Self Care | Payer: 59 | Source: Ambulatory Visit | Attending: Obstetrics and Gynecology | Admitting: Obstetrics and Gynecology

## 2019-05-04 DIAGNOSIS — R921 Mammographic calcification found on diagnostic imaging of breast: Secondary | ICD-10-CM

## 2019-05-04 DIAGNOSIS — R928 Other abnormal and inconclusive findings on diagnostic imaging of breast: Secondary | ICD-10-CM

## 2019-05-08 ENCOUNTER — Ambulatory Visit
Admission: RE | Admit: 2019-05-08 | Discharge: 2019-05-08 | Disposition: A | Discharge status: Home / Self Care | Payer: BC Managed Care – PPO | Source: Ambulatory Visit | Attending: Family Medicine | Admitting: Family Medicine

## 2019-05-08 DIAGNOSIS — R1909 Other intra-abdominal and pelvic swelling, mass and lump: Secondary | ICD-10-CM

## 2019-05-11 ENCOUNTER — Other Ambulatory Visit: Payer: Self-pay | Admitting: Family Medicine

## 2019-05-11 DIAGNOSIS — R599 Enlarged lymph nodes, unspecified: Secondary | ICD-10-CM

## 2019-05-11 DIAGNOSIS — R1909 Other intra-abdominal and pelvic swelling, mass and lump: Secondary | ICD-10-CM

## 2019-06-03 ENCOUNTER — Ambulatory Visit
Admission: RE | Admit: 2019-06-03 | Discharge: 2019-06-03 | Disposition: A | Discharge status: Home / Self Care | Payer: 59 | Source: Ambulatory Visit | Attending: Family Medicine | Admitting: Family Medicine

## 2019-06-03 DIAGNOSIS — R1909 Other intra-abdominal and pelvic swelling, mass and lump: Secondary | ICD-10-CM

## 2019-06-03 DIAGNOSIS — R599 Enlarged lymph nodes, unspecified: Secondary | ICD-10-CM

## 2019-06-03 MED ORDER — IOPAMIDOL (ISOVUE-300) INJECTION 61%
100.0000 mL | Freq: Once | INTRAVENOUS | Status: AC | PRN
  Administered 2019-06-03: 100 mL via INTRAVENOUS

## 2019-06-05 ENCOUNTER — Telehealth: Payer: Self-pay | Admitting: Family Medicine

## 2019-06-05 ENCOUNTER — Other Ambulatory Visit: Payer: Self-pay | Admitting: Family Medicine

## 2019-06-05 DIAGNOSIS — K4090 Unilateral inguinal hernia, without obstruction or gangrene, not specified as recurrent: Secondary | ICD-10-CM

## 2019-06-05 NOTE — Telephone Encounter (Signed)
Patient aware of CT results and states that he has never had surgery for a hernia repair nor has she had a hernia in the past

## 2019-06-05 NOTE — Telephone Encounter (Signed)
She has what looks to be a repaired hernia with no prior history of hernia.  This is weird.  The "plug" they are talking about is post surgical change but again no history of surgery.  Given ongoing fullness and concern, I placed referral to surgery for further evaluation to make sure 1. This isn't misread/ misinterpreted 2. She doesn't need surgical intervention to correct.

## 2019-06-05 NOTE — Telephone Encounter (Signed)
Patient understands there is a lot of stool.  She is unclear if there is a hernia?( Never had one or any surgery for hernia?)What is the note about plug mean?   Patient wants the result broke down into simple information that she can understand.  She don't understand why she would need to see a surgeon when the result has no hernia noted on it? Resuls were read on My Chart.

## 2019-06-05 NOTE — Telephone Encounter (Signed)
CT was negative for hernia.  She does have a large stool burden.  "No acute CT finding in the pelvis.   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   Large stool burden without evidence of bowel obstruction."

## 2019-06-05 NOTE — Telephone Encounter (Signed)
Left message to please call our office. 

## 2019-06-05 NOTE — Telephone Encounter (Signed)
Will place referral to general surgery for further evaluation

## 2019-06-05 NOTE — Telephone Encounter (Signed)
Review and advise on CT result.

## 2019-06-08 NOTE — Telephone Encounter (Signed)
Pt states she has never had a hernia repair and she wants this corrected and wants [place] to call [facility] to have the radiologist look at the imaging again and fix the report as it isn't accurate and this scan was for further evaluation. She doesn't want to be sent somewhere else for more evaluation until this has been corrected. Not sure if you want to try and call the pt to explain. Please advise.

## 2019-06-08 NOTE — Telephone Encounter (Signed)
I spoke to Dr. Rito Ehrlich about the CT results.  He reviewed the CAT scan and in fact thinks that this is a right inguinal hernia with associated fluid collection.  Nothing that shows bowel within the hernia.  It appears to be fat only without any concerning characteristics.  He will make an addendum to the imaging study.  I spoke to the patient on the phone with regards to these results.  She is having some discomfort when exercising but this is relieved by heat.  For now she wants to watchfully wait and hold off on seeing the surgeon.  We discussed red flag signs and symptoms warranting further evaluation.  She voiced good understanding will follow up as needed

## 2019-09-09 ENCOUNTER — Other Ambulatory Visit: Payer: 59

## 2019-09-09 ENCOUNTER — Other Ambulatory Visit: Payer: Self-pay | Admitting: *Deleted

## 2019-09-09 ENCOUNTER — Other Ambulatory Visit: Payer: Self-pay

## 2019-09-09 DIAGNOSIS — Z0000 Encounter for general adult medical examination without abnormal findings: Secondary | ICD-10-CM

## 2019-09-10 ENCOUNTER — Ambulatory Visit (INDEPENDENT_AMBULATORY_CARE_PROVIDER_SITE_OTHER): Payer: 59 | Admitting: Family Medicine

## 2019-09-10 ENCOUNTER — Other Ambulatory Visit: Payer: Self-pay

## 2019-09-10 ENCOUNTER — Encounter: Payer: Self-pay | Admitting: Family Medicine

## 2019-09-10 VITALS — BP 128/73 | HR 90 | Temp 98.5°F | Ht 63.0 in | Wt 114.0 lb

## 2019-09-10 DIAGNOSIS — Z0001 Encounter for general adult medical examination with abnormal findings: Secondary | ICD-10-CM | POA: Diagnosis not present

## 2019-09-10 DIAGNOSIS — Z0000 Encounter for general adult medical examination without abnormal findings: Secondary | ICD-10-CM

## 2019-09-10 DIAGNOSIS — M19041 Primary osteoarthritis, right hand: Secondary | ICD-10-CM | POA: Diagnosis not present

## 2019-09-10 DIAGNOSIS — K4090 Unilateral inguinal hernia, without obstruction or gangrene, not specified as recurrent: Secondary | ICD-10-CM | POA: Diagnosis not present

## 2019-09-10 LAB — CBC WITH DIFFERENTIAL/PLATELET
Basophils Absolute: 0 10*3/uL (ref 0.0–0.2)
Basos: 1 %
EOS (ABSOLUTE): 0.2 10*3/uL (ref 0.0–0.4)
Eos: 3 %
Hematocrit: 36.9 % (ref 34.0–46.6)
Hemoglobin: 12.1 g/dL (ref 11.1–15.9)
Immature Grans (Abs): 0 10*3/uL (ref 0.0–0.1)
Immature Granulocytes: 0 %
Lymphocytes Absolute: 2.3 10*3/uL (ref 0.7–3.1)
Lymphs: 44 %
MCH: 28.7 pg (ref 26.6–33.0)
MCHC: 32.8 g/dL (ref 31.5–35.7)
MCV: 88 fL (ref 79–97)
Monocytes Absolute: 0.3 10*3/uL (ref 0.1–0.9)
Monocytes: 6 %
Neutrophils Absolute: 2.5 10*3/uL (ref 1.4–7.0)
Neutrophils: 46 %
Platelets: 351 10*3/uL (ref 150–450)
RBC: 4.21 x10E6/uL (ref 3.77–5.28)
RDW: 13.4 % (ref 11.7–15.4)
WBC: 5.3 10*3/uL (ref 3.4–10.8)

## 2019-09-10 LAB — CMP14+EGFR
ALT: 16 IU/L (ref 0–32)
AST: 22 IU/L (ref 0–40)
Albumin/Globulin Ratio: 2 (ref 1.2–2.2)
Albumin: 4.5 g/dL (ref 3.8–4.9)
Alkaline Phosphatase: 60 IU/L (ref 48–121)
BUN/Creatinine Ratio: 14 (ref 12–28)
BUN: 10 mg/dL (ref 8–27)
Bilirubin Total: 0.3 mg/dL (ref 0.0–1.2)
CO2: 26 mmol/L (ref 20–29)
Calcium: 9.5 mg/dL (ref 8.7–10.3)
Chloride: 100 mmol/L (ref 96–106)
Creatinine, Ser: 0.71 mg/dL (ref 0.57–1.00)
GFR calc Af Amer: 107 mL/min/{1.73_m2} (ref >59)
GFR calc non Af Amer: 93 mL/min/{1.73_m2} (ref >59)
Globulin, Total: 2.2 g/dL (ref 1.5–4.5)
Glucose: 88 mg/dL (ref 65–99)
Potassium: 4 mmol/L (ref 3.5–5.2)
Sodium: 138 mmol/L (ref 134–144)
Total Protein: 6.7 g/dL (ref 6.0–8.5)

## 2019-09-10 LAB — LIPID PANEL
Chol/HDL Ratio: 2.8 ratio (ref 0.0–4.4)
Cholesterol, Total: 234 mg/dL — ABNORMAL HIGH (ref 100–199)
HDL: 84 mg/dL (ref >39)
LDL Chol Calc (NIH): 142 mg/dL — ABNORMAL HIGH (ref 0–99)
Triglycerides: 47 mg/dL (ref 0–149)
VLDL Cholesterol Cal: 8 mg/dL (ref 5–40)

## 2019-09-10 LAB — VITAMIN D 25 HYDROXY (VIT D DEFICIENCY, FRACTURES): Vit D, 25-Hydroxy: 124 ng/mL — ABNORMAL HIGH (ref 30.0–100.0)

## 2019-09-10 LAB — TSH: TSH: 1.38 u[IU]/mL (ref 0.450–4.500)

## 2019-09-10 NOTE — Patient Instructions (Signed)
Health Maintenance, Female Adopting a healthy lifestyle and getting preventive care are important in promoting health and wellness. Ask your health care provider about:  The right schedule for you to have regular tests and exams.  Things you can do on your own to prevent diseases and keep yourself healthy. What should I know about diet, weight, and exercise? Eat a healthy diet   Eat a diet that includes plenty of vegetables, fruits, low-fat dairy products, and lean protein.  Do not eat a lot of foods that are high in solid fats, added sugars, or sodium. Maintain a healthy weight Body mass index (BMI) is used to identify weight problems. It estimates body fat based on height and weight. Your health care provider can help determine your BMI and help you achieve or maintain a healthy weight. Get regular exercise Get regular exercise. This is one of the most important things you can do for your health. Most adults should:  Exercise for at least 150 minutes each week. The exercise should increase your heart rate and make you sweat (moderate-intensity exercise).  Do strengthening exercises at least twice a week. This is in addition to the moderate-intensity exercise.  Spend less time sitting. Even light physical activity can be beneficial. Watch cholesterol and blood lipids Have your blood tested for lipids and cholesterol at 61 years of age, then have this test every 5 years. Have your cholesterol levels checked more often if:  Your lipid or cholesterol levels are high.  You are older than 61 years of age.  You are at high risk for heart disease. What should I know about cancer screening? Depending on your health history and family history, you may need to have cancer screening at various ages. This may include screening for:  Breast cancer.  Cervical cancer.  Colorectal cancer.  Skin cancer.  Lung cancer. What should I know about heart disease, diabetes, and high blood  pressure? Blood pressure and heart disease  High blood pressure causes heart disease and increases the risk of stroke. This is more likely to develop in people who have high blood pressure readings, are of African descent, or are overweight.  Have your blood pressure checked: ? Every 3-5 years if you are 18-39 years of age. ? Every year if you are 40 years old or older. Diabetes Have regular diabetes screenings. This checks your fasting blood sugar level. Have the screening done:  Once every three years after age 40 if you are at a normal weight and have a low risk for diabetes.  More often and at a younger age if you are overweight or have a high risk for diabetes. What should I know about preventing infection? Hepatitis B If you have a higher risk for hepatitis B, you should be screened for this virus. Talk with your health care provider to find out if you are at risk for hepatitis B infection. Hepatitis C Testing is recommended for:  Everyone born from 1945 through 1965.  Anyone with known risk factors for hepatitis C. Sexually transmitted infections (STIs)  Get screened for STIs, including gonorrhea and chlamydia, if: ? You are sexually active and are younger than 61 years of age. ? You are older than 61 years of age and your health care provider tells you that you are at risk for this type of infection. ? Your sexual activity has changed since you were last screened, and you are at increased risk for chlamydia or gonorrhea. Ask your health care provider if   you are at risk.  Ask your health care provider about whether you are at high risk for HIV. Your health care provider may recommend a prescription medicine to help prevent HIV infection. If you choose to take medicine to prevent HIV, you should first get tested for HIV. You should then be tested every 3 months for as long as you are taking the medicine. Pregnancy  If you are about to stop having your period (premenopausal) and  you may become pregnant, seek counseling before you get pregnant.  Take 400 to 800 micrograms (mcg) of folic acid every day if you become pregnant.  Ask for birth control (contraception) if you want to prevent pregnancy. Osteoporosis and menopause Osteoporosis is a disease in which the bones lose minerals and strength with aging. This can result in bone fractures. If you are 65 years old or older, or if you are at risk for osteoporosis and fractures, ask your health care provider if you should:  Be screened for bone loss.  Take a calcium or vitamin D supplement to lower your risk of fractures.  Be given hormone replacement therapy (HRT) to treat symptoms of menopause. Follow these instructions at home: Lifestyle  Do not use any products that contain nicotine or tobacco, such as cigarettes, e-cigarettes, and chewing tobacco. If you need help quitting, ask your health care provider.  Do not use street drugs.  Do not share needles.  Ask your health care provider for help if you need support or information about quitting drugs. Alcohol use  Do not drink alcohol if: ? Your health care provider tells you not to drink. ? You are pregnant, may be pregnant, or are planning to become pregnant.  If you drink alcohol: ? Limit how much you use to 0-1 drink a day. ? Limit intake if you are breastfeeding.  Be aware of how much alcohol is in your drink. In the [place] one drink equals one 12 oz bottle of beer (355 mL), one 5 oz glass of wine (148 mL), or one 1 oz glass of hard liquor (44 mL). General instructions  Schedule regular health, dental, and eye exams.  Stay current with your vaccines.  Tell your health care provider if: ? You often feel depressed. ? You have ever been abused or do not feel safe at home. Summary  Adopting a healthy lifestyle and getting preventive care are important in promoting health and wellness.  Follow your health care provider's instructions about healthy  diet, exercising, and getting tested or screened for diseases.  Follow your health care provider's instructions on monitoring your cholesterol and blood pressure. This information is not intended to replace advice given to you by your health care provider. Make sure you discuss any questions you have with your health care provider. Document Revised: 02/26/2018 Document Reviewed: 02/26/2018 Elsevier Patient Education  2020 [employer]  

## 2019-09-10 NOTE — Progress Notes (Signed)
Sheila Patrick is a 59 y.o. female presents to office today for annual physical exam examination.     Diet: Balanced, high in fiber. Exercise: Regular Last eye exam: UTD, wears glasses Last colonoscopy: UTD Last mammogram: UTD Last pap smear: UTD Immunizations needed: Tdap declined  Past Medical History:  Diagnosis Date  . ARTHRITIS, CERVICAL SPINE 04/11/2009   Qualifier: Diagnosis of  By: Aline Brochure MD, Dorothyann Peng    . Back pain   . Neck pain   . PONV (postoperative nausea and vomiting)   . Shoulder pain   . Vaginal pain 04/10/2017   Social History   Socioeconomic History  . Marital status: Single    Spouse name: Not on file  . Number of children: 0  . Years of education: Not on file  . Highest education level: Not on file  Occupational History  . Occupation: Chartered certified accountant  Tobacco Use  . Smoking status: Never Smoker  . Smokeless tobacco: Never Used  Vaping Use  . Vaping Use: Never used  Substance and Sexual Activity  . Alcohol use: Not Currently    Comment: occas  . Drug use: No  . Sexual activity: Not Currently    Comment: single  Other Topics Concern  . Not on file  Social History Narrative  . Not on file   Social Determinants of Health   Financial Resource Strain:   . Difficulty of Paying Living Expenses:   Food Insecurity:   . Worried About Charity fundraiser in the Last Year:   . Arboriculturist in the Last Year:   Transportation Needs:   . Film/video editor (Medical):   [name] Lack of Transportation (Non-Medical):   Physical Activity:   . Days of Exercise per Week:   . Minutes of Exercise per Session:   Stress:   . Feeling of Stress :   Social Connections:   . Frequency of Communication with Friends and Family:   . Frequency of Social Gatherings with Friends and Family:   . Attends Religious Services:   . Active Member of Clubs or Organizations:   . Attends Archivist Meetings:   [name] Marital Status:   Intimate Partner  Violence:   . Fear of Current or Ex-Partner:   . Emotionally Abused:   [name] Physically Abused:   . Sexually Abused:    Past Surgical History:  Procedure Laterality Date  . ABDOMINAL HYSTERECTOMY     Total abdominal  . COLONOSCOPY N/A 01/21/2019   Procedure: COLONOSCOPY;  Surgeon: Rogene Houston, MD;  Location: [facility];  Service: Endoscopy;  Laterality: N/A;  930  . NASAL SINUS SURGERY  1994  . right breast lumpectomy  2002  . VESICOVAGINAL FISTULA CLOSURE W/ TAH  1998   Family History  Problem Relation Age of Onset  . Asthma Mother   . Cancer Father        lung  . Lung cancer Father   . Asthma Sister   . Hypertension Sister   . Cancer Brother        ?laryngeal  . COPD Sister   . Heart disease Sister   . Hyperlipidemia Sister   . Hypertension Sister   . Diabetes Sister   . Heart attack Sister 49  . Cirrhosis Brother   . Alcohol abuse Brother     Current Outpatient Medications:  .  b complex vitamins tablet, Take 1 tablet by mouth every other day., Disp: , Rfl:  .  cetirizine (ZYRTEC) 10 MG chewable tablet, Chew 10 mg by mouth as needed for allergies. , Disp: , Rfl:  .  cholecalciferol (VITAMIN D) 1000 units tablet, Take 1,000 Units by mouth every other day. With K2, Disp: , Rfl:  .  omega-3 acid ethyl esters (LOVAZA) 1 g capsule, Take 1 g by mouth every other day., Disp: , Rfl:   Allergies  Allergen Reactions  . Other Other (See Comments)    Dye (unknown)     ROS: Review of Systems Constitutional: negative Eyes: positive for contacts/glasses Ears, nose, mouth, throat, and face: negative Respiratory: pos recent URI Cardiovascular: negative Gastrointestinal: positive for constipation Genitourinary:negative Integument/breast: pos for nipple inversion (followed by GYN) Hematologic/lymphatic: negative Musculoskeletal:positive for arthralgias Neurological: negative Behavioral/Psych: negative Endocrine: negative Allergic/Immunologic: negative    Physical  exam BP 128/73   Pulse 90   Temp 98.5 F (36.9 C)   Ht 5\' 3"  (1.6 m)   Wt 114 lb (51.7 kg)   SpO2 100%   BMI 20.19 kg/m  General appearance: alert, cooperative, appears stated age and no distress Head: Normocephalic, without obvious abnormality, atraumatic Eyes: negative findings: conjunctivae and sclerae normal, corneas clear and pupils equal, round, reactive to light and accomodation Ears: normal TM's and external ear canals both ears Nose: Nares normal. Septum midline. Mucosa normal. No drainage or sinus tenderness. Throat: lips, mucosa, and tongue normal; teeth and gums normal Neck: no adenopathy, no carotid bruit, supple, symmetrical, trachea midline and thyroid not enlarged, symmetric, no tenderness/mass/nodules Back: symmetric, no curvature. ROM normal. No CVA tenderness. Lungs: clear to auscultation bilaterally Heart: regular rate and rhythm, S1, S2 normal, no murmur, click, rub or gallop Abdomen: soft, non-tender; bowel sounds normal; no masses,  no organomegaly Extremities: extremities normal, atraumatic, no cyanosis or edema; right hand with mild arthritic changes in the DIP of digits 2-3. Pulses: 2+ and symmetric Skin: Skin color, texture, turgor normal. No rashes or lesions Lymph nodes: Cervical, supraclavicular, and axillary nodes normal. Neurologic: Alert and oriented X 3, normal strength and tone. Normal symmetric reflexes. Normal coordination and gait Psych: mood stable, speech normal, affect appropriate. Depression screen [facility] 2/9 09/10/2019 04/28/2019 11/25/2018  Decreased Interest 0 0 0  Down, Depressed, Hopeless 0 0 0  PHQ - 2 Score 0 0 0  Altered sleeping - 0 0  Tired, decreased energy - 0 0  Change in appetite - 0 0  Feeling bad or failure about yourself  - 0 0  Trouble concentrating - 0 0  Moving slowly or fidgety/restless - 0 0  Suicidal thoughts - 0 0  PHQ-9 Score - 0 0       Assessment/ Plan: 01/25/2019 here for annual physical exam.   1.  Annual physical exam  2. Non-recurrent unilateral inguinal hernia without obstruction or gangrene Stable. Minimally symptomatic.  3. Arthritis of hand, right Voltaren gel OTC or Tylenol arthritis.  Likely postraumatic.   Handout on healthy lifestyle choices, including diet (rich in fruits, vegetables and lean meats and low in salt and simple carbohydrates) and exercise (at least 30 minutes of moderate physical activity daily).  Patient to follow up in 1 year for annual exam or sooner if needed.  Xavier Fournier M. [name], DO

## 2019-09-14 ENCOUNTER — Encounter: Payer: 59 | Admitting: Family Medicine

## 2019-09-30 ENCOUNTER — Ambulatory Visit: Payer: 59 | Admitting: Family Medicine

## 2020-09-06 ENCOUNTER — Telehealth: Payer: Self-pay | Admitting: Family Medicine

## 2020-09-06 DIAGNOSIS — Z0000 Encounter for general adult medical examination without abnormal findings: Secondary | ICD-10-CM

## 2020-09-06 NOTE — Telephone Encounter (Signed)
Pt has cpe apt next week with [facility]. She wants to come in few days before apt for labs in the morning. Please place orders and call her when done. Pt asked for a call back.

## 2020-09-06 NOTE — Telephone Encounter (Signed)
Orders placed, patient advised

## 2020-09-12 ENCOUNTER — Other Ambulatory Visit: Payer: Self-pay

## 2020-09-12 ENCOUNTER — Other Ambulatory Visit: Payer: 59

## 2020-09-12 DIAGNOSIS — Z0000 Encounter for general adult medical examination without abnormal findings: Secondary | ICD-10-CM

## 2020-09-13 ENCOUNTER — Ambulatory Visit (INDEPENDENT_AMBULATORY_CARE_PROVIDER_SITE_OTHER): Payer: 59 | Admitting: Family Medicine

## 2020-09-13 ENCOUNTER — Encounter: Payer: Self-pay | Admitting: Family Medicine

## 2020-09-13 VITALS — BP 124/75 | HR 68 | Temp 97.6°F | Ht 63.0 in | Wt 109.4 lb

## 2020-09-13 DIAGNOSIS — Z0001 Encounter for general adult medical examination with abnormal findings: Secondary | ICD-10-CM

## 2020-09-13 DIAGNOSIS — E7800 Pure hypercholesterolemia, unspecified: Secondary | ICD-10-CM

## 2020-09-13 DIAGNOSIS — Z780 Asymptomatic menopausal state: Secondary | ICD-10-CM | POA: Diagnosis not present

## 2020-09-13 DIAGNOSIS — M8580 Other specified disorders of bone density and structure, unspecified site: Secondary | ICD-10-CM

## 2020-09-13 DIAGNOSIS — Z0000 Encounter for general adult medical examination without abnormal findings: Secondary | ICD-10-CM

## 2020-09-13 LAB — CBC WITH DIFFERENTIAL/PLATELET
Basophils Absolute: 0 10*3/uL (ref 0.0–0.2)
Basos: 1 %
EOS (ABSOLUTE): 0.1 10*3/uL (ref 0.0–0.4)
Eos: 2 %
Hematocrit: 35.2 % (ref 34.0–46.6)
Hemoglobin: 12.1 g/dL (ref 11.1–15.9)
Immature Grans (Abs): 0 10*3/uL (ref 0.0–0.1)
Immature Granulocytes: 0 %
Lymphocytes Absolute: 2.2 10*3/uL (ref 0.7–3.1)
Lymphs: 47 %
MCH: 30.6 pg (ref 26.6–33.0)
MCHC: 34.4 g/dL (ref 31.5–35.7)
MCV: 89 fL (ref 79–97)
Monocytes Absolute: 0.4 10*3/uL (ref 0.1–0.9)
Monocytes: 8 %
Neutrophils Absolute: 2 10*3/uL (ref 1.4–7.0)
Neutrophils: 42 %
Platelets: 252 10*3/uL (ref 150–450)
RBC: 3.95 x10E6/uL (ref 3.77–5.28)
RDW: 12.8 % (ref 11.7–15.4)
WBC: 4.7 10*3/uL (ref 3.4–10.8)

## 2020-09-13 LAB — CMP14+EGFR
ALT: 12 IU/L (ref 0–32)
AST: 18 IU/L (ref 0–40)
Albumin/Globulin Ratio: 1.9 (ref 1.2–2.2)
Albumin: 4.2 g/dL (ref 3.8–4.8)
Alkaline Phosphatase: 57 IU/L (ref 44–121)
BUN/Creatinine Ratio: 14 (ref 12–28)
BUN: 11 mg/dL (ref 8–27)
Bilirubin Total: 0.4 mg/dL (ref 0.0–1.2)
CO2: 23 mmol/L (ref 20–29)
Calcium: 9 mg/dL (ref 8.7–10.3)
Chloride: 101 mmol/L (ref 96–106)
Creatinine, Ser: 0.8 mg/dL (ref 0.57–1.00)
Globulin, Total: 2.2 g/dL (ref 1.5–4.5)
Glucose: 93 mg/dL (ref 65–99)
Potassium: 3.9 mmol/L (ref 3.5–5.2)
Sodium: 137 mmol/L (ref 134–144)
Total Protein: 6.4 g/dL (ref 6.0–8.5)
eGFR: 84 mL/min/{1.73_m2} (ref >59)

## 2020-09-13 LAB — VITAMIN D 25 HYDROXY (VIT D DEFICIENCY, FRACTURES): Vit D, 25-Hydroxy: 58.4 ng/mL (ref 30.0–100.0)

## 2020-09-13 LAB — THYROID PANEL WITH TSH
Free Thyroxine Index: 1.6 (ref 1.2–4.9)
T3 Uptake Ratio: 25 % (ref 24–39)
T4, Total: 6.5 ug/dL (ref 4.5–12.0)
TSH: 0.852 u[IU]/mL (ref 0.450–4.500)

## 2020-09-13 LAB — LIPID PANEL
Chol/HDL Ratio: 2.8 ratio (ref 0.0–4.4)
Cholesterol, Total: 226 mg/dL — ABNORMAL HIGH (ref 100–199)
HDL: 80 mg/dL (ref >39)
LDL Chol Calc (NIH): 140 mg/dL — ABNORMAL HIGH (ref 0–99)
Triglycerides: 37 mg/dL (ref 0–149)
VLDL Cholesterol Cal: 6 mg/dL (ref 5–40)

## 2020-09-13 NOTE — Progress Notes (Signed)
Sheila Patrick is a 60 y.o. female presents to office today for annual physical exam examination.    Concerns today include: 1. Pure HLD Continues to take over-the-counter fish oils, cod liver oil.  She remains physically active and maintains a very balanced diet.  This is low in carbohydrates and simple sugars.  No chest pain, shortness of breath, change in exercise tolerance.  2. Osteopenia Also monitored with routine DEXA scans by her OB/GYN.  She is not quite due according to her OB/GYN for repeat DEXA.  She is compliant with vitamin D and gets adequate stores of calcium and D from her diet.  Diet: Balanced, Exercise: Regular Last colonoscopy: UTD Last mammogram: UTD Last pap smear: n/a Refills needed today: None Immunizations needed:  Immunization History  Administered Date(s) Administered   Influenza-Unspecified 12/17/2017     Past Medical History:  Diagnosis Date   ARTHRITIS, CERVICAL SPINE 04/11/2009   Qualifier: Diagnosis of  By: Romeo Apple MD, Stanley     Back pain    Neck pain    PONV (postoperative nausea and vomiting)    Shoulder pain    Vaginal pain 04/10/2017   Social History   Socioeconomic History   Marital status: Single    Spouse name: Not on file   Number of children: 0   Years of education: Not on file   Highest education level: Not on file  Occupational History   Occupation: administrative assitant  Tobacco Use   Smoking status: Never   Smokeless tobacco: Never  Vaping Use   Vaping Use: Never used  Substance and Sexual Activity   Alcohol use: Not Currently    Comment: occas   Drug use: No   Sexual activity: Not Currently    Comment: single  Other Topics Concern   Not on file  Social History Narrative   Not on file   Social Determinants of Health   Financial Resource Strain: Not on file  Food Insecurity: Not on file  Transportation Needs: Not on file  Physical Activity: Not on file  Stress: Not on file  Social Connections: Not on  file  Intimate Partner Violence: Not on file   Past Surgical History:  Procedure Laterality Date   ABDOMINAL HYSTERECTOMY     Total abdominal   COLONOSCOPY N/A 01/21/2019   Procedure: COLONOSCOPY;  Surgeon: Malissa Hippo, MD;  Location: [facility];  Service: Endoscopy;  Laterality: N/A;  930   NASAL SINUS SURGERY  1994   right breast lumpectomy  2002   VESICOVAGINAL FISTULA CLOSURE W/ TAH  1998   Family History  Problem Relation Age of Onset   Asthma Mother    Cancer Father        lung   Lung cancer Father    Asthma Sister    Hypertension Sister    Cancer Brother        ?laryngeal   COPD Sister    Heart disease Sister    Hyperlipidemia Sister    Hypertension Sister    Diabetes Sister    Heart attack Sister 31   Cirrhosis Brother    Alcohol abuse Brother     Current Outpatient Medications:    b complex vitamins tablet, Take 1 tablet by mouth every other day., Disp: , Rfl:    cetirizine (ZYRTEC) 10 MG chewable tablet, Chew 10 mg by mouth as needed for allergies. , Disp: , Rfl:    cholecalciferol (VITAMIN D) 1000 units tablet, Take 1,000 Units by mouth  every other day. With K2, Disp: , Rfl:    Cod Liver Oil 1000 MG CAPS, Take by mouth., Disp: , Rfl:    Omega-3 Fatty Acids (FISH OIL) 1000 MG CAPS, Take by mouth., Disp: , Rfl:   Allergies  Allergen Reactions   Other Other (See Comments)    Dye (unknown)     ROS: Review of Systems Pertinent items noted in HPI and remainder of comprehensive ROS otherwise negative.    Physical exam BP 124/75   Pulse 68   Temp 97.6 F (36.4 C)   Ht 5\' 3"  (1.6 m)   Wt 109 lb 6.4 oz (49.6 kg)   SpO2 100%   BMI 19.38 kg/m  General appearance: alert, cooperative, appears stated age, and no distress Head: Normocephalic, without obvious abnormality, atraumatic Eyes: negative findings: lids and lashes normal, conjunctivae and sclerae normal, and corneas clear Ears:  normal ears externally Nose: no discharge Throat:  normal. No  LAD Back: symmetric, no curvature. ROM normal. No CVA tenderness. Lungs: clear to auscultation bilaterally Heart: regular rate and rhythm, S1, S2 normal, no murmur, click, rub or gallop Abdomen: soft, non-tender; bowel sounds normal; no masses,  no organomegaly Extremities: extremities normal, atraumatic, no cyanosis or edema Pulses: 2+ and symmetric Skin: Skin color, texture, turgor normal. No rashes or lesions Lymph nodes: Cervical, supraclavicular, and axillary nodes normal. Neurologic: Grossly normal    Assessment/ Plan: here for annual physical exam.   Annual physical exam  Osteopenia after menopause - Plan: DG [facility] DEXA  Pure hypercholesterolemia  Continue adequate weightbearing exercise, calcium and vitamin D in diet  LDL essentially unchanged from previous.  We reviewed her labs in detail today and a copy has been provided to her today.  Her ASCVD risk score is less than 7% so I do not think that cholesterol medication is needed but she may continue her over-the-counter supplements.  Continue healthy lifestyle choices.  Patient to follow up in 1 year for annual exam or sooner if needed.  Anmarie Fukushima M. [name], DO

## 2020-11-21 ENCOUNTER — Encounter (HOSPITAL_COMMUNITY): Payer: Self-pay

## 2020-11-21 ENCOUNTER — Emergency Department (HOSPITAL_COMMUNITY)
Admission: EM | Admit: 2020-11-21 | Discharge: 2020-11-21 | Disposition: A | Discharge status: Home / Self Care | Payer: 59 | Attending: Student | Admitting: Student

## 2020-11-21 DIAGNOSIS — R110 Nausea: Secondary | ICD-10-CM | POA: Diagnosis not present

## 2020-11-21 DIAGNOSIS — R42 Dizziness and giddiness: Secondary | ICD-10-CM | POA: Insufficient documentation

## 2020-11-21 DIAGNOSIS — H8110 Benign paroxysmal vertigo, unspecified ear: Secondary | ICD-10-CM

## 2020-11-21 LAB — URINALYSIS, MICROSCOPIC (REFLEX): Bacteria, UA: NONE SEEN

## 2020-11-21 LAB — CBC
HCT: 38.7 % (ref 36.0–46.0)
Hemoglobin: 13.3 g/dL (ref 12.0–15.0)
MCH: 31.2 pg (ref 26.0–34.0)
MCHC: 34.4 g/dL (ref 30.0–36.0)
MCV: 90.8 fL (ref 80.0–100.0)
Platelets: 275 10*3/uL (ref 150–400)
RBC: 4.26 MIL/uL (ref 3.87–5.11)
RDW: 13.4 % (ref 11.5–15.5)
WBC: 6.3 10*3/uL (ref 4.0–10.5)
nRBC: 0 % (ref 0.0–0.2)

## 2020-11-21 LAB — URINALYSIS, ROUTINE W REFLEX MICROSCOPIC
Bilirubin Urine: NEGATIVE
Glucose, UA: NEGATIVE mg/dL
Ketones, ur: 15 mg/dL — AB
Leukocytes,Ua: NEGATIVE
Nitrite: NEGATIVE
Protein, ur: NEGATIVE mg/dL
Specific Gravity, Urine: 1.01 (ref 1.005–1.030)
pH: 6 (ref 5.0–8.0)

## 2020-11-21 LAB — BASIC METABOLIC PANEL
Anion gap: 7 (ref 5–15)
BUN: 13 mg/dL (ref 8–23)
CO2: 26 mmol/L (ref 22–32)
Calcium: 8.9 mg/dL (ref 8.9–10.3)
Chloride: 99 mmol/L (ref 98–111)
Creatinine, Ser: 0.71 mg/dL (ref 0.44–1.00)
GFR, Estimated: >60 mL/min (ref >60)
Glucose, Bld: 101 mg/dL — ABNORMAL HIGH (ref 70–99)
Potassium: 3.5 mmol/L (ref 3.5–5.1)
Sodium: 132 mmol/L — ABNORMAL LOW (ref 135–145)

## 2020-11-21 LAB — CBG MONITORING, ED
Glucose-Capillary: 87 mg/dL (ref 70–99)
Glucose-Capillary: 84 mg/dL (ref 70–99)

## 2020-11-21 MED ORDER — MECLIZINE HCL 12.5 MG PO TABS
25.0000 mg | ORAL_TABLET | Freq: Once | ORAL | Status: AC
  Administered 2020-11-21: 25 mg via ORAL
  Filled 2020-11-21: qty 2

## 2020-11-21 MED ORDER — MECLIZINE HCL 25 MG PO TABS: 25.0000 mg | ORAL_TABLET | Freq: Three times a day (TID) | ORAL | 0 refills | Status: DC | PRN

## 2020-11-21 NOTE — ED Triage Notes (Signed)
Pt. States they had a dizzy spell this morning. Pt. States they felt like they were going to pass out. Pt. States they have never felt that before.

## 2020-11-21 NOTE — ED Provider Notes (Signed)
[facility] EMERGENCY DEPARTMENT Provider Note   CSN: [id number] Arrival date & time: 11/21/20  1244     History Chief Complaint  Patient presents with   Dizziness    Sheila Patrick is a 60 y.o. female.  Who presents emergency complaint of dizziness neck after she tried a glass of warm water with Coy Saunas and that she was almost finishing her's.  She has never had anything like this before.  She was unable to stand or walk.  She had no tinnitus.  She came to her house was about later and she had improvement in her symptoms.  Helped her get to the bathroom.  She has had some mild associated nausea without vomiting.  She currently has very mild symptoms has been ambulatory.  Nothing seems to make her dizziness worse or better.  She denies any other focal neurologic deficits, headache, changes in vision.   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     Family History  Problem Relation Age of Onset   Asthma Mother    Cancer Father         lung   Lung cancer Father    Asthma Sister    Hypertension Sister    Cancer Brother        ?laryngeal   COPD Sister    Heart disease Sister    Hyperlipidemia Sister    Hypertension Sister    Diabetes Sister    Heart attack Sister 46   Cirrhosis Brother    Alcohol abuse Brother     Social History   Tobacco Use   Smoking status: Never   Smokeless tobacco: Never  Vaping Use   Vaping Use: Never used  Substance Use Topics   Alcohol use: Not Currently    Comment: occas   Drug use: No    Home Medications Prior to Admission medications   Medication Sig Start Date End Date Taking? Authorizing Provider  b complex vitamins tablet Take 1 tablet by mouth every other day.    [provider]  cetirizine (ZYRTEC) 10 MG chewable tablet Chew 10 mg by mouth as needed for allergies.     [provider]  cholecalciferol (VITAMIN D) 1000 units tablet Take 1,000 Units by mouth every other day. With K2    [provider]  [facility] Liver Oil 1000 MG CAPS Take by mouth.    [provider]  Omega-3 Fatty Acids (FISH OIL) 1000 MG CAPS Take by mouth.    [provider]    Allergies    Other  Review of Systems   Review of Systems  Neurological:  Positive for dizziness.   Physical Exam Updated Vital Signs BP (!) 142/80   Pulse 65   Temp 97.8 F (36.6 C) (Oral)   Resp 17   Ht 5\' 3"  (1.6 m)   Wt 49.9 kg   SpO2 100%   BMI 19.49 kg/m   Physical Exam Vitals and nursing note reviewed.  Constitutional:      General: She is not in acute distress.    Appearance: She is well-developed. She is not diaphoretic.  HENT:     Head: Normocephalic and atraumatic.     Right Ear: External ear normal.     Left Ear: External ear normal.     Nose: Nose normal.     Mouth/Throat:     Mouth: Mucous membranes are moist.  Eyes:     General: No scleral icterus.    Extraocular Movements: Extraocular movements intact.     Conjunctiva/sclera: Conjunctivae normal.      Pupils: Pupils are equal, round, and reactive to light.     Comments: Positive for right work nystagmus greater than 3 beats.  No vertical rotational or bilateral Nystagmus.  Cardiovascular:     Rate and Rhythm: Normal rate and regular rhythm.     Heart sounds: Normal heart sounds. No murmur heard.   No friction rub. No gallop.  Pulmonary:     Effort: Pulmonary effort is normal. No respiratory distress.     Breath sounds: Normal breath sounds.  Abdominal:     General: Bowel sounds are normal. There is no distension.     Palpations: Abdomen is soft. There is no mass.     Tenderness: There is no abdominal tenderness. There is no guarding.  Musculoskeletal:     Cervical back: Normal range of motion.  Skin:    General: Skin is warm and dry.  Neurological:     Mental Status: She is alert and oriented to person, place, and time.     Comments: Speech is clear and goal oriented, follows commands Major Cranial nerves without deficit, no facial droop Normal strength in upper and lower extremities bilaterally including dorsiflexion and plantar flexion, strong and equal grip strength Sensation normal to light and sharp touch Moves extremities without ataxia, coordination intact Normal finger to nose and rapid alternating movements Neg romberg, no pronator drift Normal gait Normal heel-shin and balance  Psychiatric:        Behavior: Behavior normal.    ED Results / Procedures / Treatments   Labs (all labs ordered are listed, but only abnormal results are displayed) Labs Reviewed  BASIC METABOLIC PANEL - Abnormal; Notable for the following components:      Result Value   Sodium 132 (*)    Glucose, Bld 101 (*)    All other components within normal limits  CBC  URINALYSIS, ROUTINE W REFLEX MICROSCOPIC  CBG MONITORING, ED  CBG MONITORING, ED    EKG None  Radiology No results found.  Procedures Procedures   Medications Ordered in ED Medications  meclizine (ANTIVERT) tablet  25 mg (has no administration in time range)    ED Course  I have reviewed the triage vital signs and the nursing notes.  Pertinent labs & imaging results that were available during my care of the patient were reviewed by me  and considered in my medical decision making (see chart for details).    MDM Rules/Calculators/A&P                          60 year old female who presents with sudden onset vertigo which is now resolved.The emergent differential diagnosis for acute vertigo low includes peripheral causes such as BPPV, barotrauma, ear foreign body, Mnire's disease, infectious causes such as lip bronchitis, vestibular neuritis or Ramsay Hunt syndrome.  Other emergent causes are central such as cerebellar stroke, vertebrobasilar insufficiency, neoplastic causes, vertebral artery dissection, MS, neurosyphilis or tuberculosis, epilepsy or migraine.  Other causes include anemia, hyperviscosity syndrome, alcohol or aminoglycoside use, renal failure, hypoglycemia and thyroid disease. Patient's vital signs show mildly elevated blood pressure. I ordered and reviewed labs, CBC urinalysis and CBG without significant abnormality BMP shows no significant abnormalities.  I ordered and reviewed an EKG which shows sinus rhythm at a rate of 65. Patient exam findings consistent with peripheral vertigo.  She has right lateral nystagmus, no other abnormalities on her physical examination.  Symptoms are most consistent with BPPV.  I do not feel that the patient needs further imaging to rule out central cause.  Patient given meclizine here in the emergency department.  She has normal balance and gait, normal finger-nose rapid alternating movements and heel-to-shin.  Patient will be discharged with meclizine, advised to follow-up with her primary care physician for her elevated blood pressures.  Patient given handout on Epley's maneuvers.  She peers otherwise appropriate for discharge at this time.  Final Clinical  Impression(s) / ED Diagnoses Final diagnoses:  None    Rx / DC Orders ED Discharge Orders     None        Arthor Captain, PA-C 11/21/20 1903    Maia Plan, MD 11/22/20 1759

## 2020-11-21 NOTE — Discharge Instructions (Addendum)
   Your labs show no significant abnormalities. Your blood pressure is somewhat elevated and you should have it rechecked by your primary provider. You appear to have BPPV and you may read the attached paperwork. I believe your Right ear is affected. You may read and try the Epley's maneuver to improve your symptoms. Take meclizine if you have the spinning sensation or nausea.   Contact a health care provider if: You have a fever. Your condition gets worse or you develop new symptoms. Your family or friends notice any behavioral changes. You have nausea or vomiting that gets worse. You have numbness or a prickling and tingling sensation. Get help right away if you: Have difficulty speaking or moving. Are always dizzy or faint. Develop severe headaches. Have weakness in your legs or arms. Have changes in your hearing or vision. Develop a stiff neck. Develop sensitivity to light.

## 2020-11-23 ENCOUNTER — Telehealth: Payer: Self-pay | Admitting: Family Medicine

## 2020-11-23 NOTE — Telephone Encounter (Signed)
Pt was seen in ER on Labor Day for Vertigo and wants to schedule ER follow up with Dr Nadine Counts.

## 2020-11-25 NOTE — Telephone Encounter (Signed)
Appointment has already been scheduled on 11/29/20 and patient is aware

## 2020-11-29 ENCOUNTER — Ambulatory Visit (INDEPENDENT_AMBULATORY_CARE_PROVIDER_SITE_OTHER): Payer: 59 | Admitting: Family Medicine

## 2020-11-29 ENCOUNTER — Encounter: Payer: Self-pay | Admitting: Family Medicine

## 2020-11-29 ENCOUNTER — Other Ambulatory Visit: Payer: Self-pay

## 2020-11-29 VITALS — BP 141/74 | HR 71 | Temp 98.0°F | Ht 63.0 in | Wt 111.0 lb

## 2020-11-29 DIAGNOSIS — M25561 Pain in right knee: Secondary | ICD-10-CM | POA: Diagnosis not present

## 2020-11-29 DIAGNOSIS — H8111 Benign paroxysmal vertigo, right ear: Secondary | ICD-10-CM | POA: Diagnosis not present

## 2020-11-29 DIAGNOSIS — E871 Hypo-osmolality and hyponatremia: Secondary | ICD-10-CM

## 2020-11-29 NOTE — Progress Notes (Signed)
Subjective: CC: ER follow-up PCP: Raliegh Ip, DO EXH:BZJIR R Sheila Patrick is a 60 y.o. female presenting to clinic today for:  1.  BPPV Patient was seen in the ER last week for vertigo.  She had documented right nystagmus during that exam.  She otherwise had no focal neurologic abnormalities.  Lab results were unremarkable.  EKG normal.  Since symptoms have resolved and she had no other concerning features, no further imaging or laboratory testing was performed.  She was demonstrated the Epley maneuver encouraged to follow-up with PCP.  She still feels somewhat strange.  The right ear feels off.  She does not report any vertiginous symptoms but just does not feel right.  She is asking for referral to ENT for further evaluation.  Would like to be seen in [place].  Her previous ENT has retired.  She has not performed the Epley maneuver.  Denies any balance issues, unilateral weakness or sensory changes.  She has been tried incorporate a little salt into her water because she had hyponatremia noted on last labs  The only other complaint she has is some right lateral knee pain.  She points to the insertion site of IT band.  She has been applying ice today and it does feel slightly better   ROS: Per HPI  Allergies  Allergen Reactions   Other Other (See Comments)    Dye (unknown)   Past Medical History:  Diagnosis Date   ARTHRITIS, CERVICAL SPINE 04/11/2009   Qualifier: Diagnosis of  By: Romeo Apple MD, [place]     Back pain    Neck pain    PONV (postoperative nausea and vomiting)    Shoulder pain    Vaginal pain 04/10/2017    Current Outpatient Medications:    b complex vitamins tablet, Take 1 tablet by mouth every other day., Disp: , Rfl:    cetirizine (ZYRTEC) 10 MG chewable tablet, Chew 10 mg by mouth as needed for allergies. , Disp: , Rfl:    cholecalciferol (VITAMIN D) 1000 units tablet, Take 1,000 Units by mouth every other day. With K2, Disp: , Rfl:    Cod Liver Oil 1000 MG  CAPS, Take by mouth., Disp: , Rfl:    meclizine (ANTIVERT) 25 MG tablet, Take 1 tablet (25 mg total) by mouth 3 (three) times daily as needed for dizziness., Disp: 30 tablet, Rfl: 0   Omega-3 Fatty Acids (FISH OIL) 1000 MG CAPS, Take by mouth., Disp: , Rfl:  Social History   Socioeconomic History   Marital status: Single    Spouse name: Not on file   Number of children: 0   Years of education: Not on file   Highest education level: Not on file  Occupational History   Occupation: administrative assitant  Tobacco Use   Smoking status: Never   Smokeless tobacco: Never  Vaping Use   Vaping Use: Never used  Substance and Sexual Activity   Alcohol use: Not Currently    Comment: occas   Drug use: No   Sexual activity: Not Currently    Comment: single  Other Topics Concern   Not on file  Social History Narrative   Not on file   Social Determinants of Health   Financial Resource Strain: Not on file  Food Insecurity: Not on file  Transportation Needs: Not on file  Physical Activity: Not on file  Stress: Not on file  Social Connections: Not on file  Intimate Partner Violence: Not on file   Family History  Problem Relation Age of Onset   Asthma Mother    Cancer Father        lung   Lung cancer Father    Asthma Sister    Hypertension Sister    Cancer Brother        ?laryngeal   COPD Sister    Heart disease Sister    Hyperlipidemia Sister    Hypertension Sister    Diabetes Sister    Heart attack Sister 33   Cirrhosis Brother    Alcohol abuse Brother     Objective: Office vital signs reviewed. BP (!) 141/74   Pulse 71   Temp 98 F (36.7 C)   Ht 5\' 3"  (1.6 m)   Wt 111 lb (50.3 kg)   SpO2 100%   BMI 19.66 kg/m   Physical Examination:  General: Awake, alert, well nourished, No acute distress HEENT: Left TM obscured by cerumen.  Right TM intact with normal light reflex, no erythema or bulging Neuro: No focal neurologic deficits.  No nystagmus noted on my  exam MSK: Right knee: No gross swelling, no tenderness palpation over the lateral knee  Assessment/ Plan: 60 y.o. female   Benign paroxysmal positional vertigo of right ear - Plan: Ambulatory referral to ENT  Hyponatremia - Plan: Basic Metabolic Panel  Pain in lateral portion of right knee  Not actively having vertigo but not quite feeling her normal self either.  Would like to be seen by ENT and this referral has been placed.  I gave her handout on Epley maneuver and encouraged her to utilize if she develops vertigo.  We will recheck BMP given mild hyponatremia noted  Pain in the lateral knee is getting better with ice.  No appreciable deficits but I suspect a tendinitis.  Agree with ongoing ice, stretches  No orders of the defined types were placed in this encounter.  No orders of the defined types were placed in this encounter.    66, DO [facility] [phone]

## 2020-11-29 NOTE — Patient Instructions (Signed)
You had labs performed today.  You will be contacted with the results of the labs once they are available, usually in the next 3 business days for routine lab work.  If you have an active my chart account, they will be released to your MyChart.  If you prefer to have these labs released to you via telephone, please let us know.  Ear Irrigation Ear irrigation is a procedure to wash dirt and wax out of your ear canal. This procedure is also called lavage. You may need ear irrigation if you are having trouble hearing because of a buildup of earwax. You may also have ear irrigation as part of the treatment for an ear infection. Getting wax and dirt out of your ear canal can help ear drops work better. Tell a health care provider about: Any allergies you have. All medicines you are taking, including vitamins, herbs, eye drops, creams, and over-the-counter medicines. Any problems you or family members have had with anesthetic medicines. Any blood disorders you have. Any surgeries you have had. This includes any ear surgeries. Any medical conditions you have. Whether you are pregnant or may be pregnant. What are the risks? Generally, this is a safe procedure. However, problems may occur, including: Infection. Pain. Hearing loss. Fluid and debris being pushed through the eardrum and into the middle ear. This can occur if there are holes in the eardrum. Ear irrigation failing to work. What happens before the procedure? You will talk with your provider about the procedure and plan. You may be given ear drops to put in your ear 15-20 minutes before irrigation. This helps loosen the wax. What happens during the procedure?  A syringe is filled with water or saline solution, which is made of salt and water. The syringe is gently inserted into the ear canal. The fluid is used to flush out wax and other debris. The procedure may vary among health care providers and hospitals. What can I expect after the  procedure? After an ear irrigation, follow instructions given to you by your health care provider. Follow these instructions at home: Using ear irrigation kits Ear irrigation kits are available for use at home. Ask your health care provider if this is an option for you. In general, you should: Use a home irrigation kit only as told by your health care provider. Read the package instructions carefully. Follow the directions for using the syringe. Use water that is room temperature. Do not do ear irrigation at home if you: Have diabetes. Diabetes increases the risk of infection. Have a hole or tear in your eardrum. Have tubes in your ears. Have had any ear surgery in the past. Have been told not to irrigate your ears. Cleaning your ears  Clean the outside of your ear with a soft washcloth daily. If told by your health care provider, use a few drops of baby oil, mineral oil, glycerin, hydrogen peroxide, or over-the-counter earwax softening drops. Do not use cotton swabs to clean your ears. These can push wax down into the ear canal. Do not put anything into your ears to try to remove wax. This includes ear candles. General instructions Take over-the-counter and prescription medicines only as told by your health care provider. If you were prescribed an antibiotic medicine, use it as told by your health care provider. Do not stop using the antibiotic even if your condition improves. Keep the ear clean and dry by following the instructions from your health care provider. Keep all follow-up visits.  This is important. Visit your health care provider at least once a year to have your ears and hearing checked. Contact a health care provider if: Your hearing is not improving or is getting worse. You have pain or redness in your ear. You are dizzy. You have ringing in your ears. You have nausea or vomiting. You have fluid, blood, or pus coming out of your ear. Summary Ear irrigation is a  procedure to wash dirt and wax out of your ear canal. This procedure is also called lavage. To perform ear irrigation, ear drops may be put in your ear 15-20 minutes before irrigation. Water or saline solution will be used to flush out earwax and other debris. You may be able to irrigate your ears at home. Ask your health care provider if this is an option for you. Follow your health care provider's instructions. Clean your ears with a soft cloth after irrigation. Do not use cotton swabs to clean your ears. These can push wax down into the ear canal. This information is not intended to replace advice given to you by your health care provider. Make sure you discuss any questions you have with your health care provider. Document Revised: 06/23/2019 Document Reviewed: 06/23/2019 Elsevier Patient Education  [place].

## 2020-11-30 LAB — BASIC METABOLIC PANEL
BUN/Creatinine Ratio: 22 (ref 12–28)
BUN: 15 mg/dL (ref 8–27)
CO2: 26 mmol/L (ref 20–29)
Calcium: 9.4 mg/dL (ref 8.7–10.3)
Chloride: 101 mmol/L (ref 96–106)
Creatinine, Ser: 0.69 mg/dL (ref 0.57–1.00)
Glucose: 91 mg/dL (ref 65–99)
Potassium: 4.2 mmol/L (ref 3.5–5.2)
Sodium: 139 mmol/L (ref 134–144)
eGFR: 98 mL/min/{1.73_m2} (ref >59)

## 2020-12-02 ENCOUNTER — Telehealth: Payer: Self-pay | Admitting: Family Medicine

## 2020-12-02 NOTE — Telephone Encounter (Signed)
[facility] called to inform us they do not take [facility].  Patient will need to be referred elsewhere.  Thank you.

## 2020-12-07 ENCOUNTER — Other Ambulatory Visit: Payer: Self-pay | Admitting: Family Medicine

## 2020-12-07 DIAGNOSIS — H8111 Benign paroxysmal vertigo, right ear: Secondary | ICD-10-CM

## 2020-12-07 NOTE — Telephone Encounter (Signed)
Done please inform pt

## 2021-04-04 IMAGING — MG MM DIGITAL DIAGNOSTIC UNILAT*L* W/ TOMO W/ CAD
6 series · 6 of 14 positions shown · non-contrast
Comparison: Previous exam(s).

CLINICAL DATA: 58-year-old patient presents for follow-up of
probably benign left breast calcifications.

EXAM:
DIGITAL DIAGNOSTIC UNILATERAL LEFT MAMMOGRAM WITH CAD AND TOMO

[L CC]
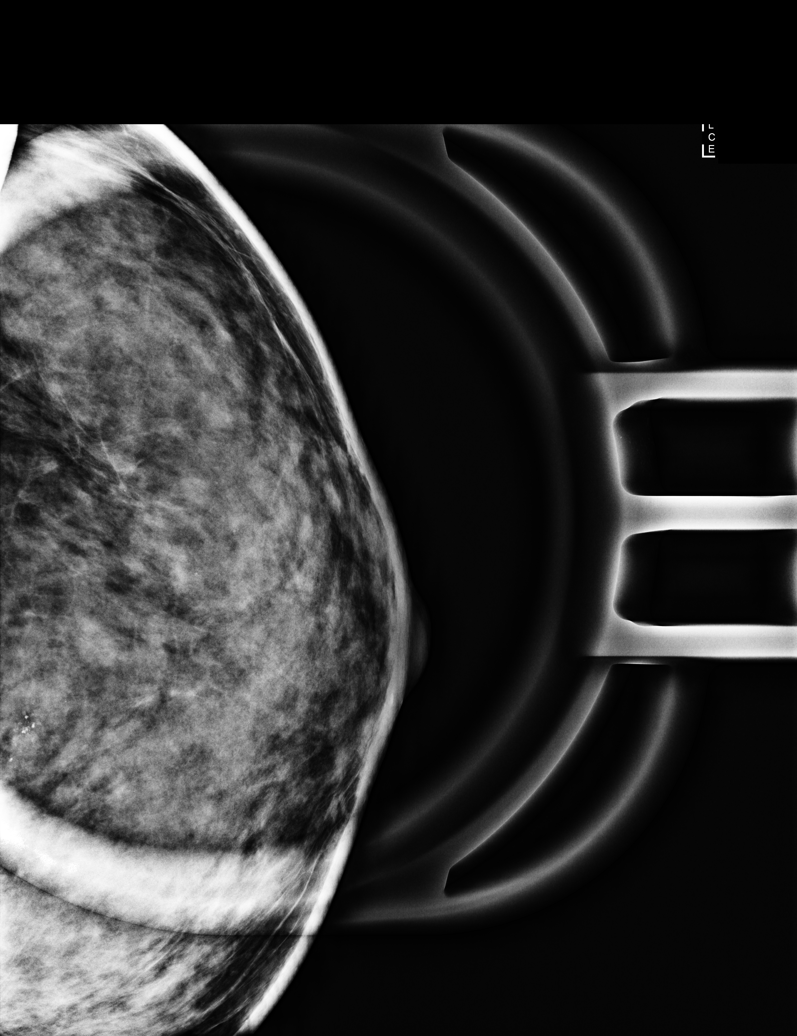

[L ML]
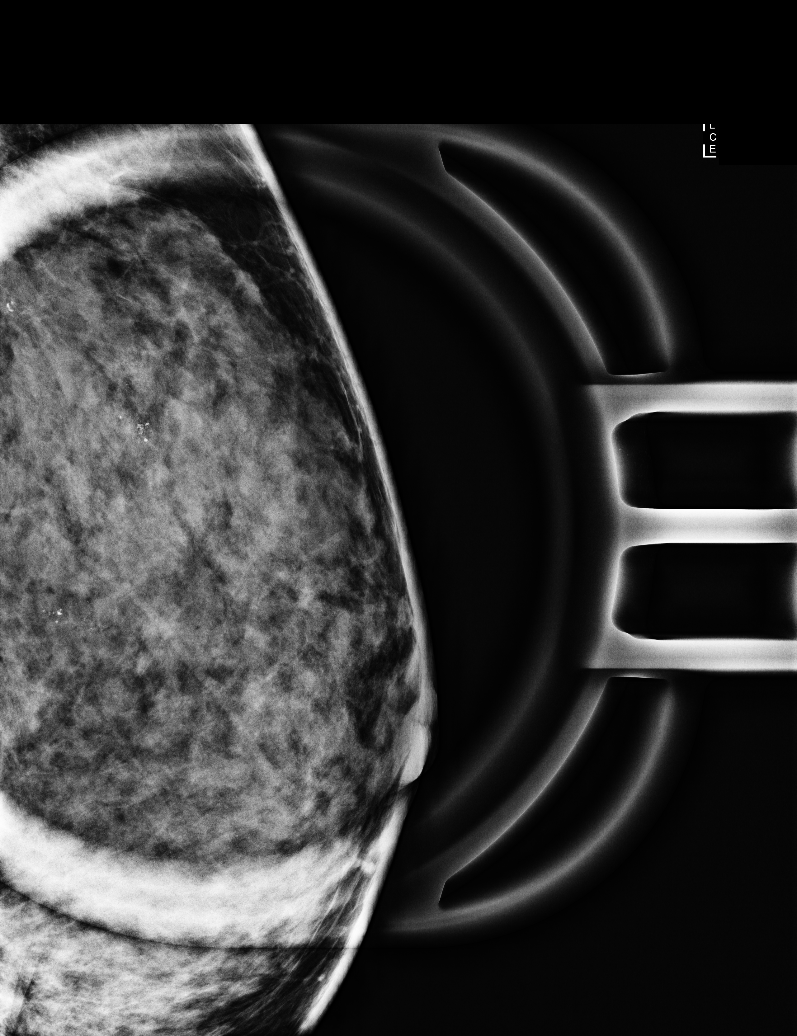

[L MLO synth-2D]
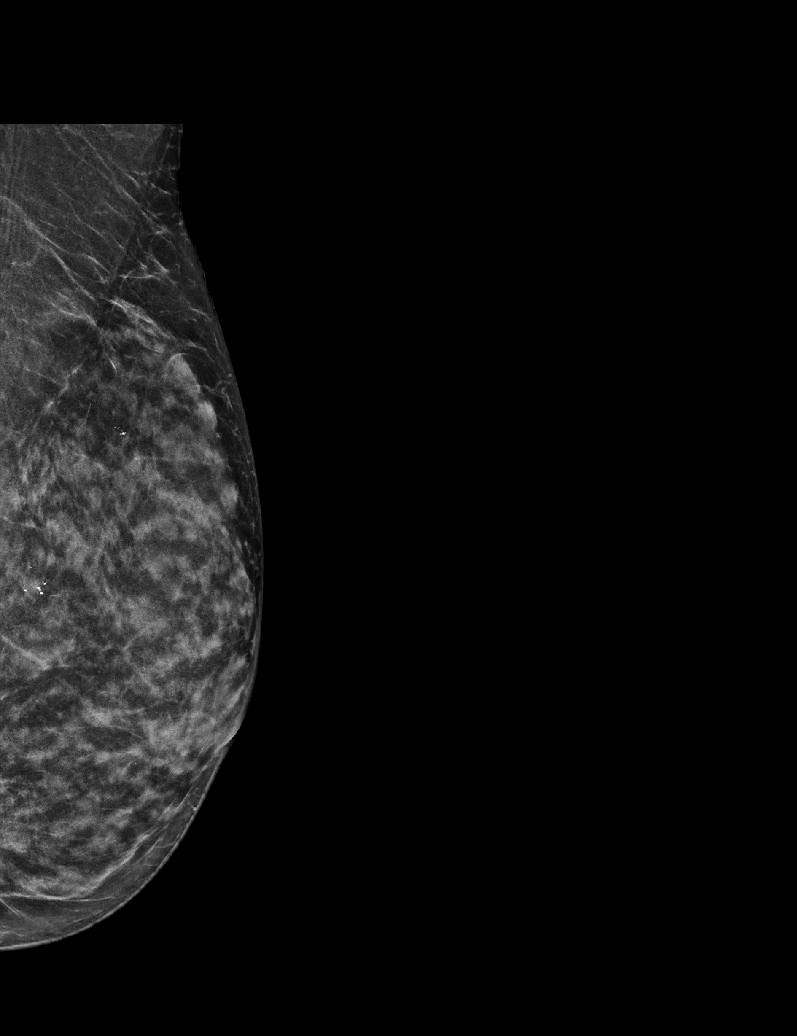

[L CC synth-2D]
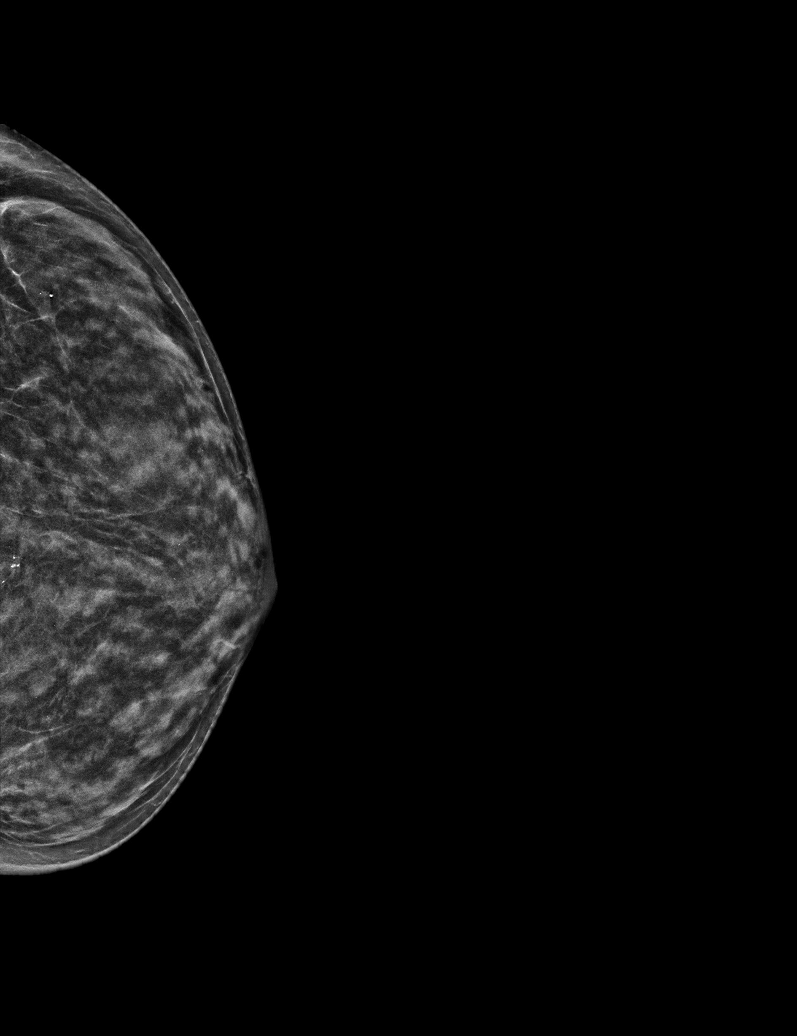

[L CC tomo · tomo slice 25/48.0]
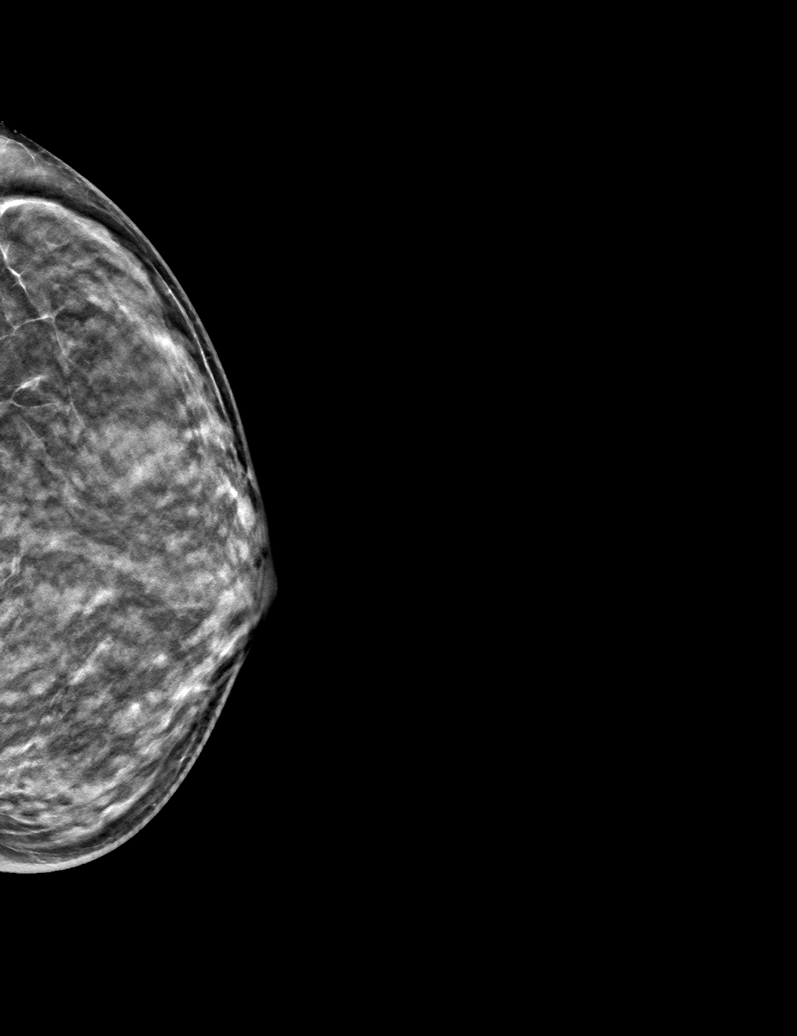

[L MLO tomo · tomo slice 26/51.0]
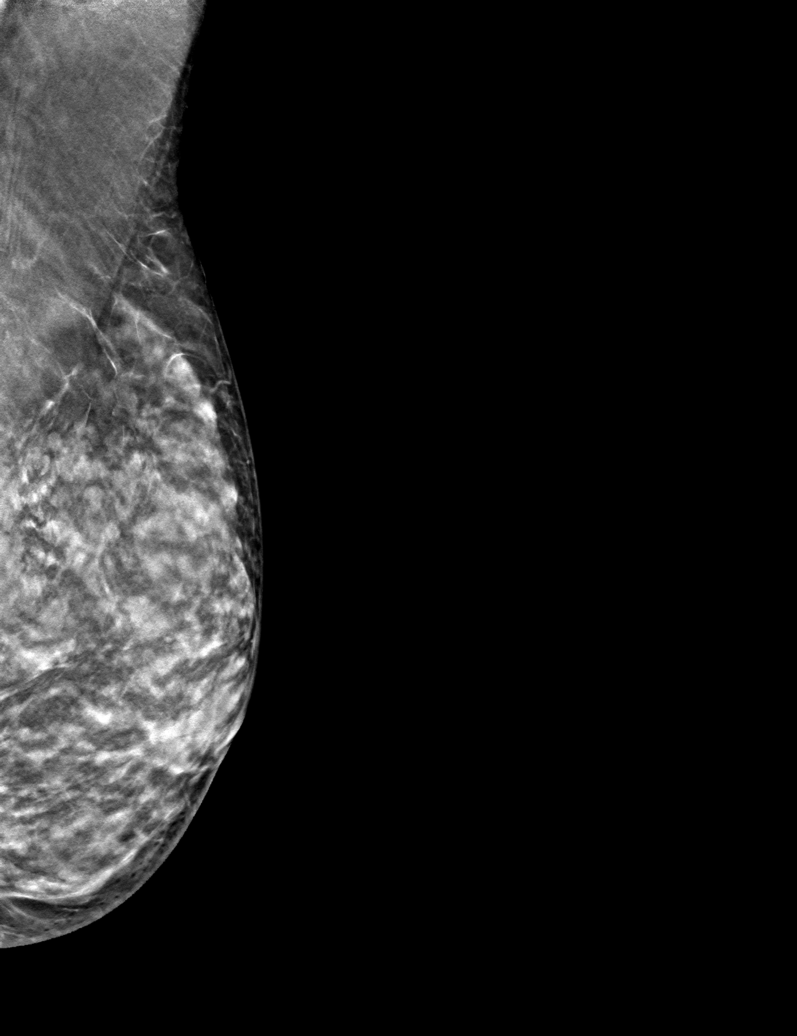

[6 of 14 positions shown; findings below may reference images not displayed]

ACR Breast Density Category d: The breast tissue is extremely dense,
which lowers the sensitivity of mammography.
FINDINGS: On the whole breast views, stable appearance of a group of
calcifications in the upper slightly inner left breast. On
magnification views, these calcifications span approximately 7 x 3 x
5 mm, similar to prior. No mass or architectural distortion is
identified in the left breast.

Mammographic images were processed with CAD.
IMPRESSION: Stable probably benign calcifications left breast.

RECOMMENDATION:
Bilateral diagnostic mammogram is recommended in September 2019.

I have discussed the findings and recommendations with the patient.
If applicable, a reminder letter will be sent to the patient
regarding the next appointment.

BI-RADS CATEGORY  3: Probably benign.

## 2021-04-08 IMAGING — US US PELVIS LIMITED
1 series · 14 of 18 positions shown · non-contrast
Comparison: None.

CLINICAL DATA: Right groin mass x4 weeks, evaluate for hernia

EXAM:
ULTRASOUND OF Right GROIN SOFT TISSUES
TECHNIQUE: Ultrasound examination of the groin soft tissues was performed in
the area of clinical concern.

[Series 1: us pelvis limited · 0.06mm/px · 18 acquisitions, 14 frames shown]
[im 1/18]
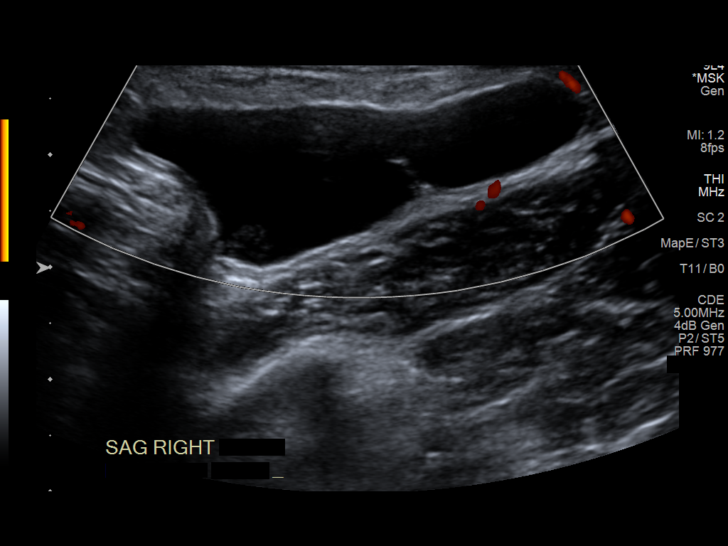
[im 2/18]
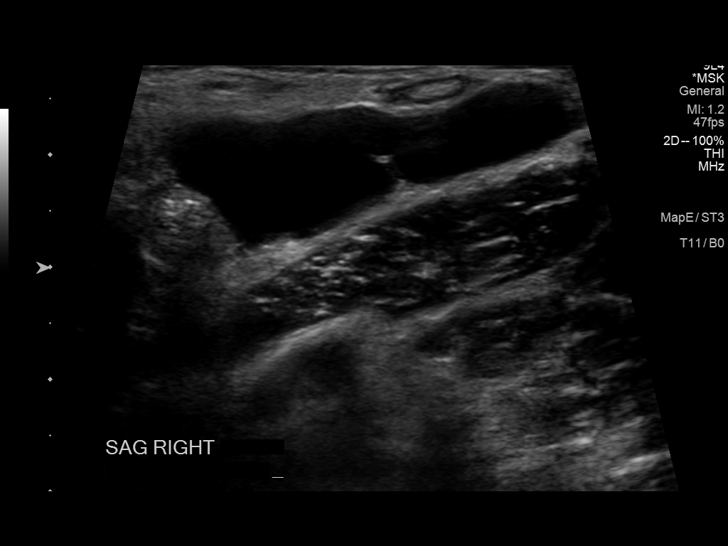
[im 4/18]
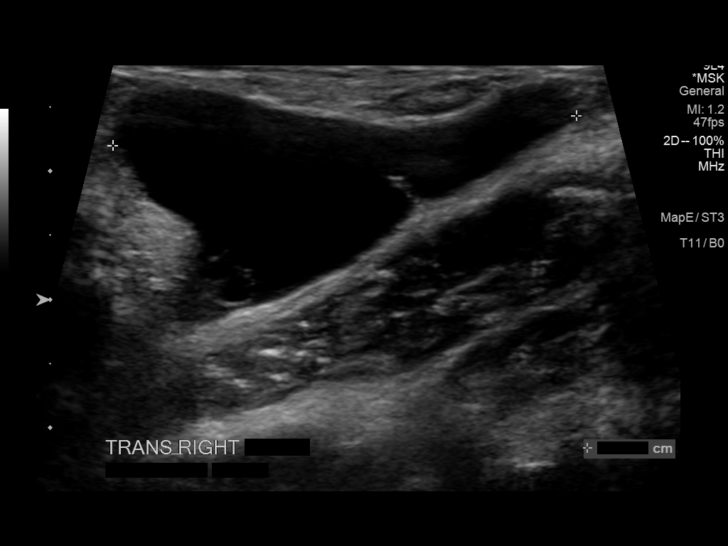
[im 5/18]
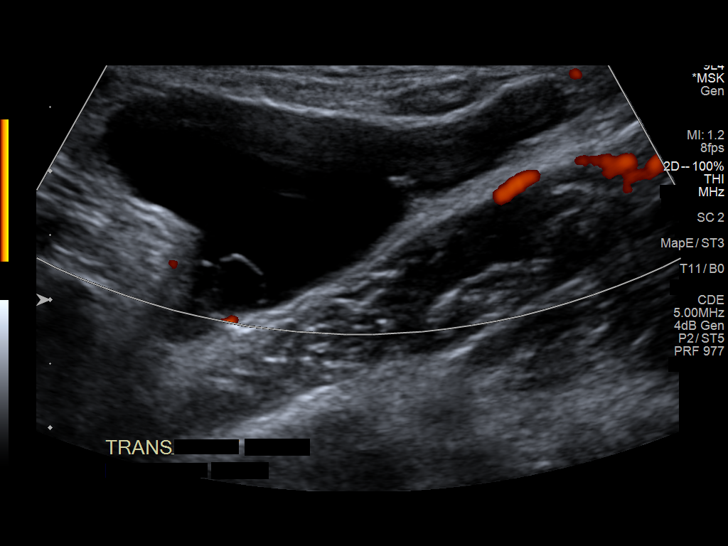
[im 6/18]
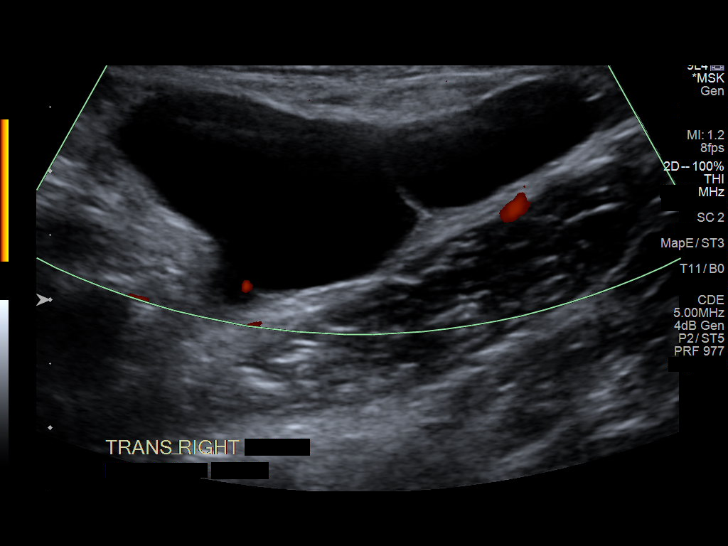
[im 8/18]
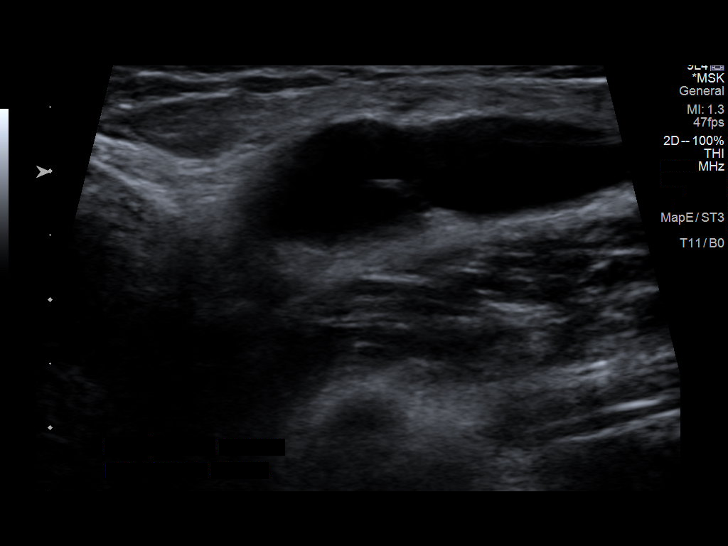
[im 9/18]
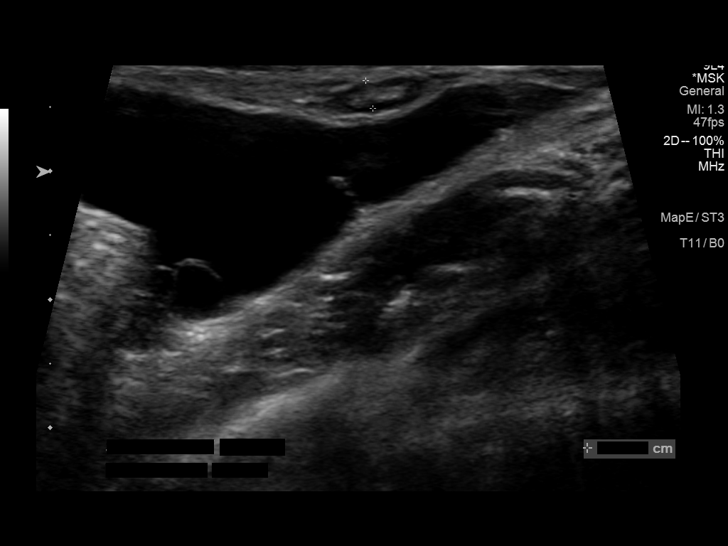
[im 10/18]
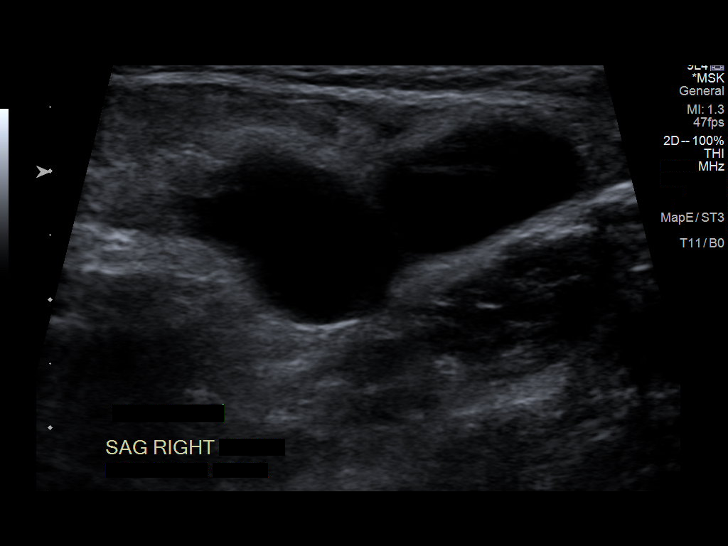
[im 11/18]
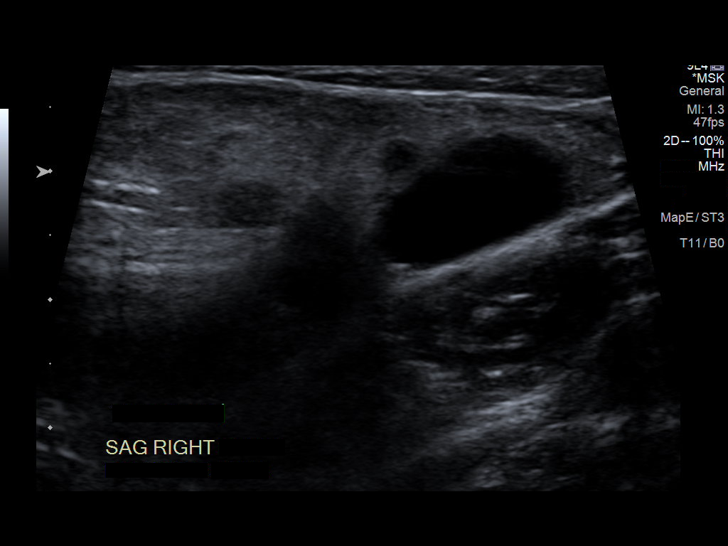
[im 13/18]
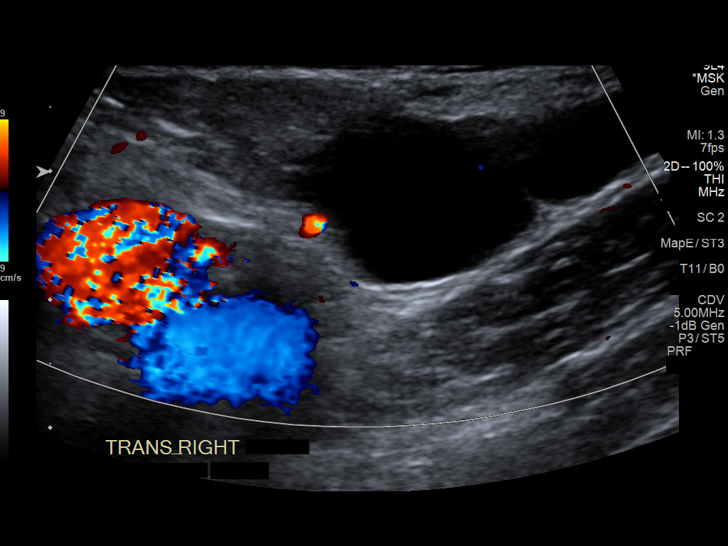
[im 14/18]
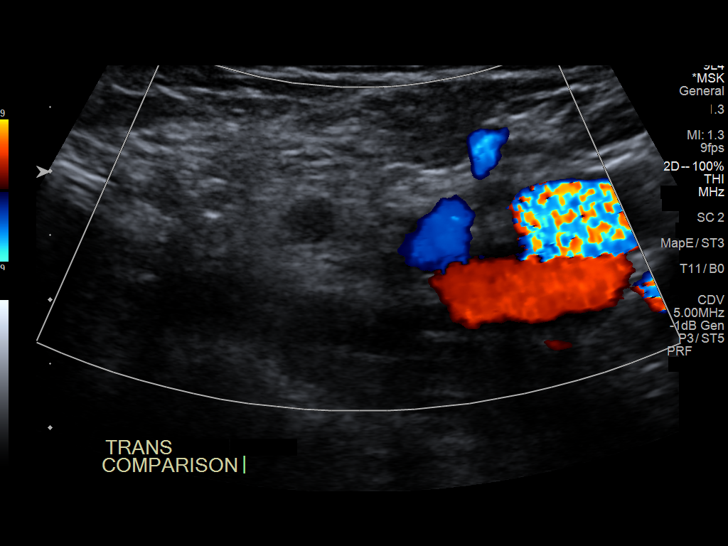
[im 15/18]
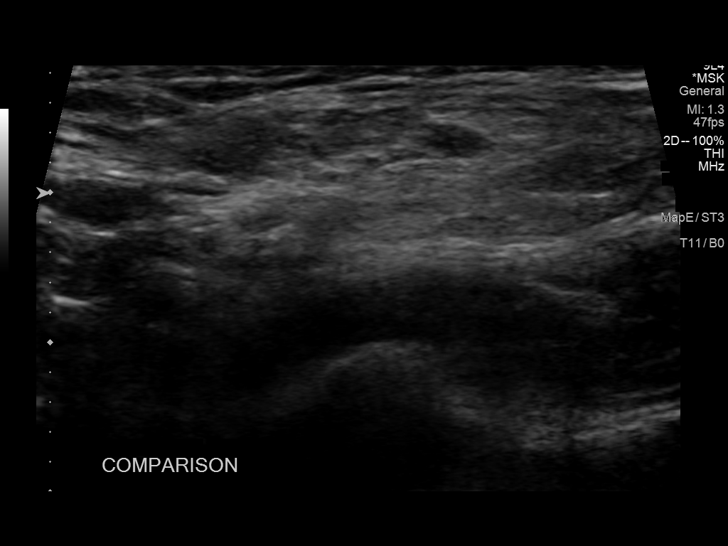
[im 17/18]
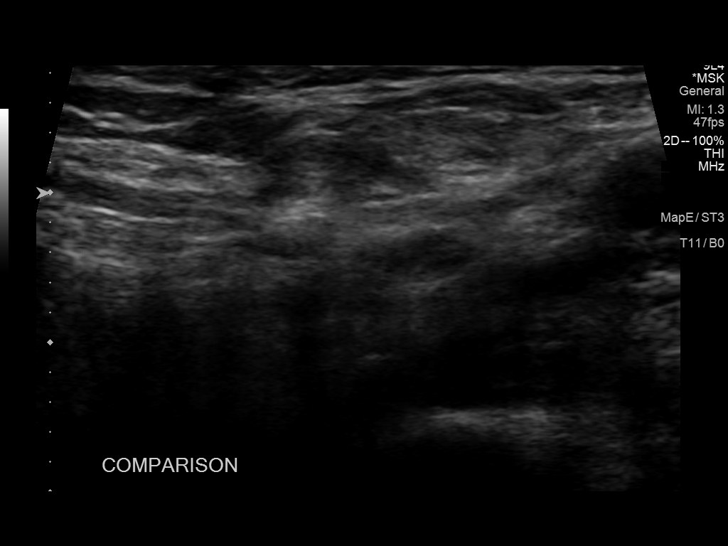
[im 18/18]
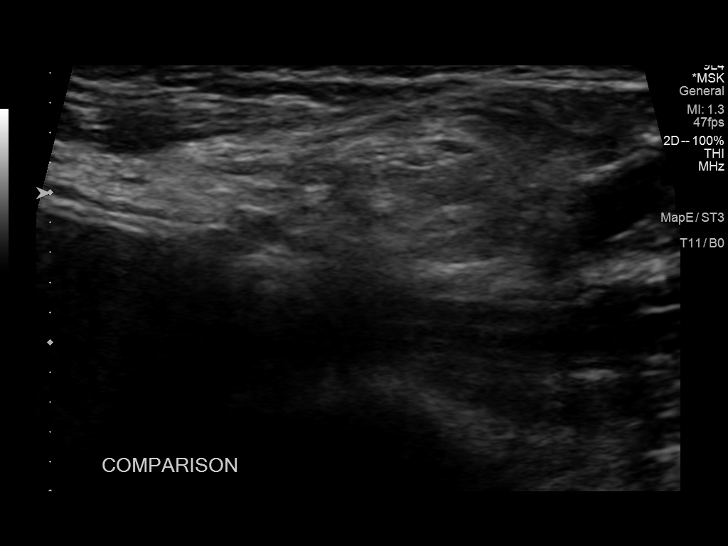

[14 of 18 positions shown; findings below may reference images not displayed]

FINDINGS: Palpable abnormality corresponds to a 4.0 x 1.4 x 3.6 cm mildly
complex cystic lesion in the right inguinal region. No solid
component or vascularity. No definite intra-abdominal communication
to confirm a hernia.
IMPRESSION: 4.0 cm mildly complex cystic lesion in the right inguinal region.
This does not definitely reflect a hernia. CT pelvis with contrast
is suggested for further evaluation.

## 2021-05-04 IMAGING — CT CT PELVIS W/ CM
2 of 3 series · 10 of 46 positions shown, 11 images · IV contrast (iopamidol)
Comparison: None. Pelvis ultrasonography dated May 08, 2019.
COMPARISON: None. Pelvis ultrasonography dated May 08, 2019.

Addendum:
CLINICAL DATA: Questionable complex cyst in the right inguinal area
versus hernia versus lymphadenopathy. The no history of cancer.
Total abdominal hysterectomy and surgical vesiculo-vaginal fistula
repair.

EXAM:
CT PELVIS WITH CONTRAST
TECHNIQUE: Multidetector CT imaging of the pelvis was performed using the
standard protocol following the bolus administration of intravenous
contrast. Automatic exposure control utilized.
CONTRAST:  100mL A2BW7I-CPP IOPAMIDOL (A2BW7I-CPP) INJECTION 61%

[Series 2: pelvis 5.00 br40 s3 · axial · 0.45mm/px · z∈[+1164,+1359]mm · 7 of 49 slices shown, 8 images]
[im 5/49  soft-tissue]
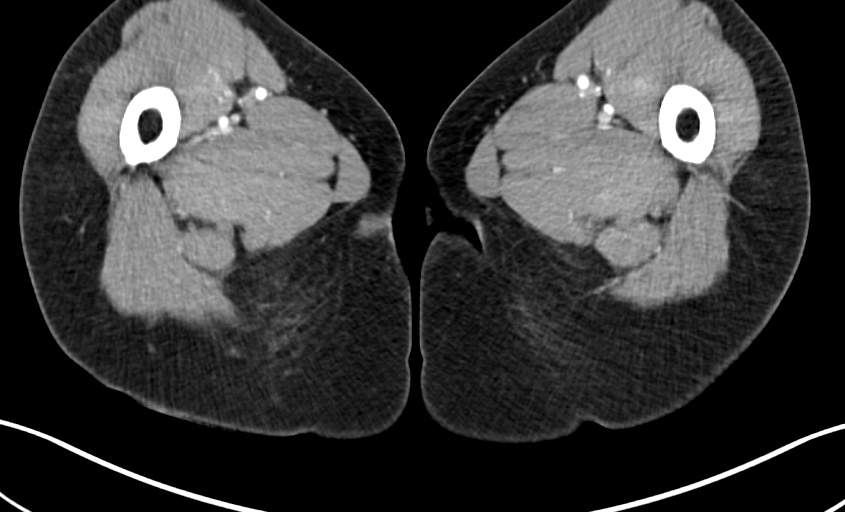
[im 5/49  bone]
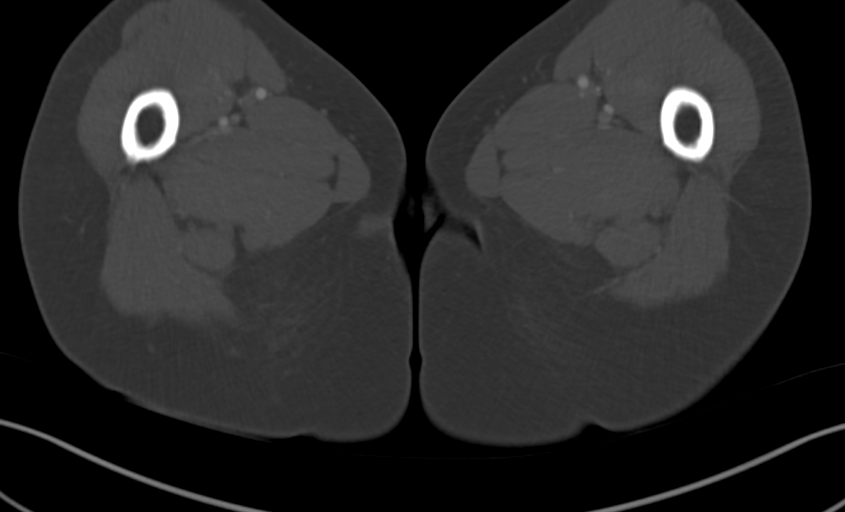
[im 11/49  soft-tissue]
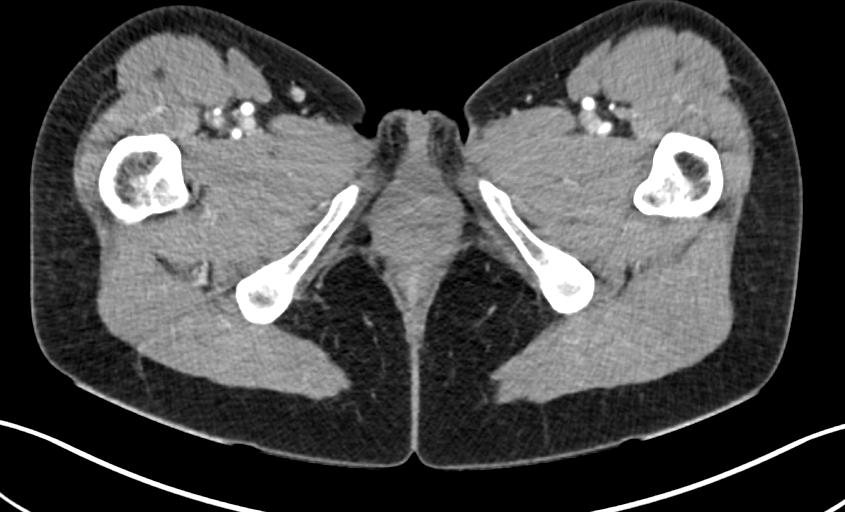
[im 18/49  soft-tissue]
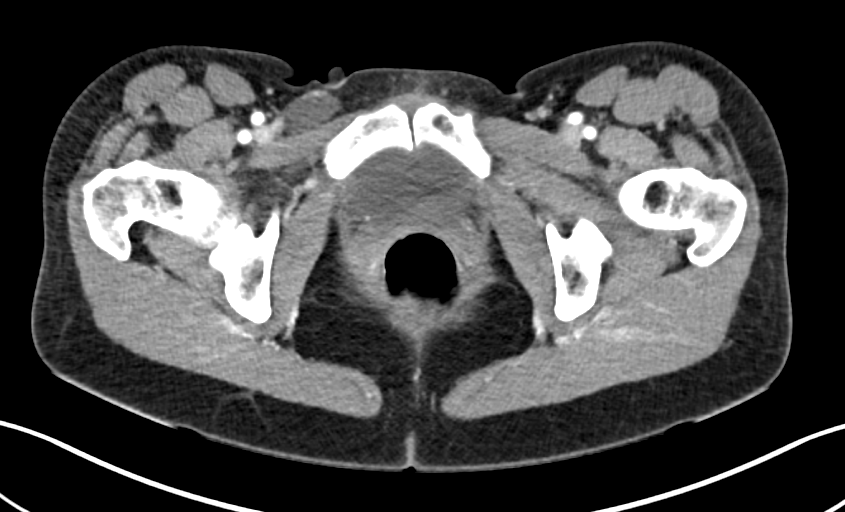
[im 25/49  soft-tissue]
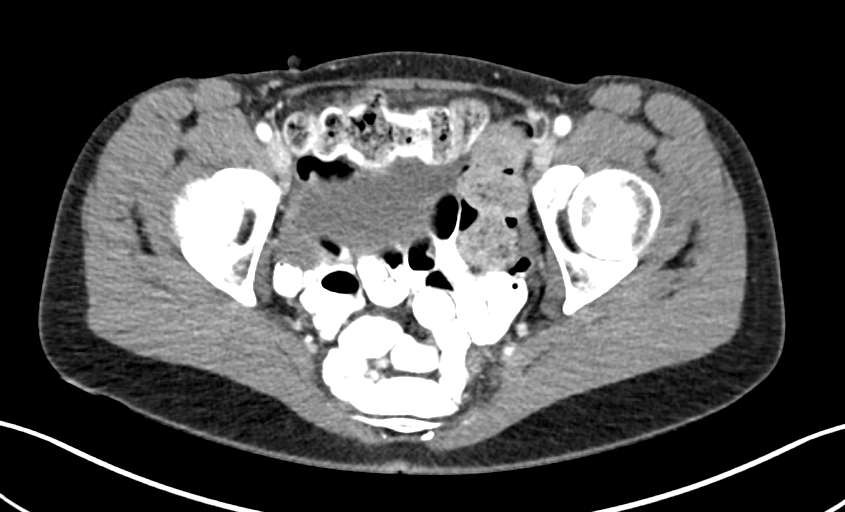
[im 31/49  soft-tissue]
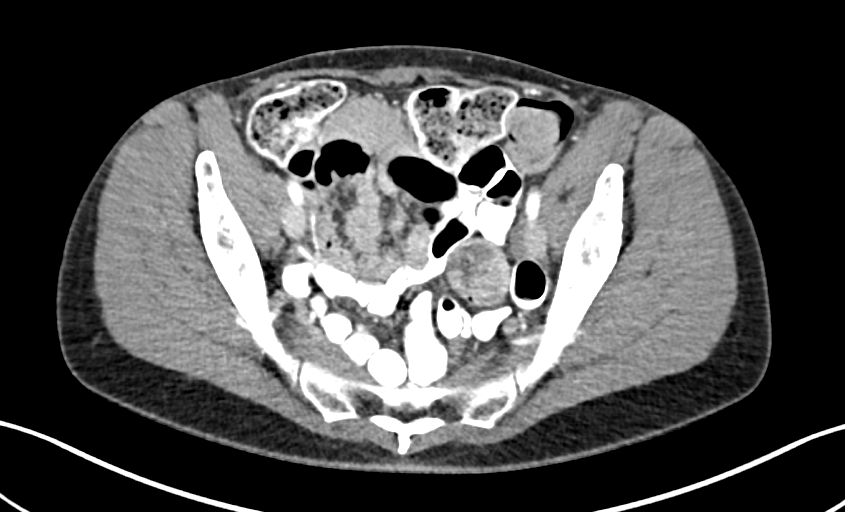
[im 38/49  soft-tissue]
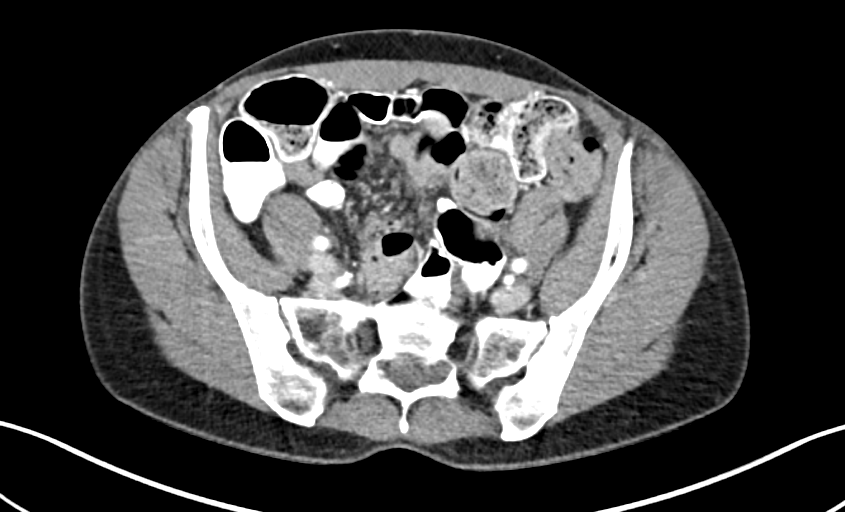
[im 44/49  soft-tissue]
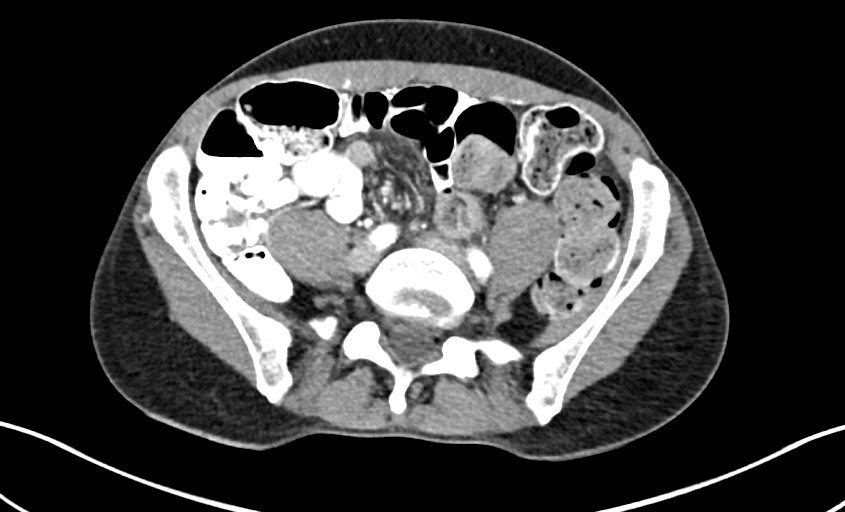

[Series 4: pelvis 2.00 br40 s3 · coronal · 0.48mm/px · 3 of 123 slices shown]
[im 41/123  soft-tissue]
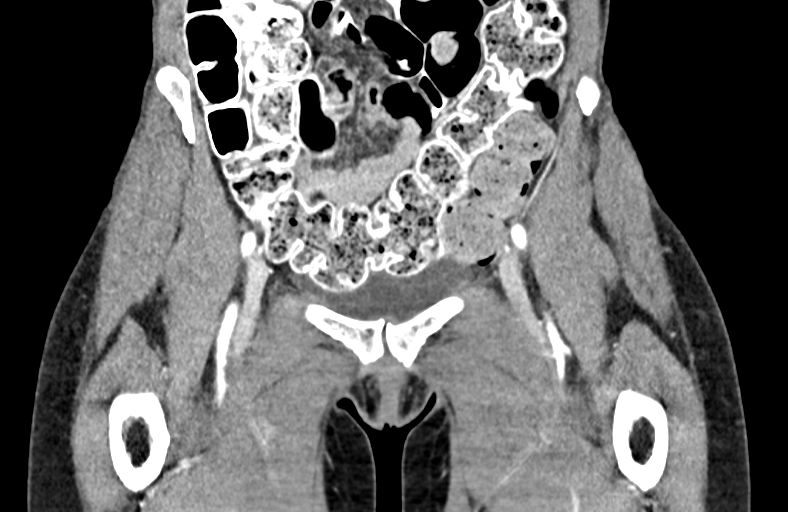
[im 55/123  soft-tissue]
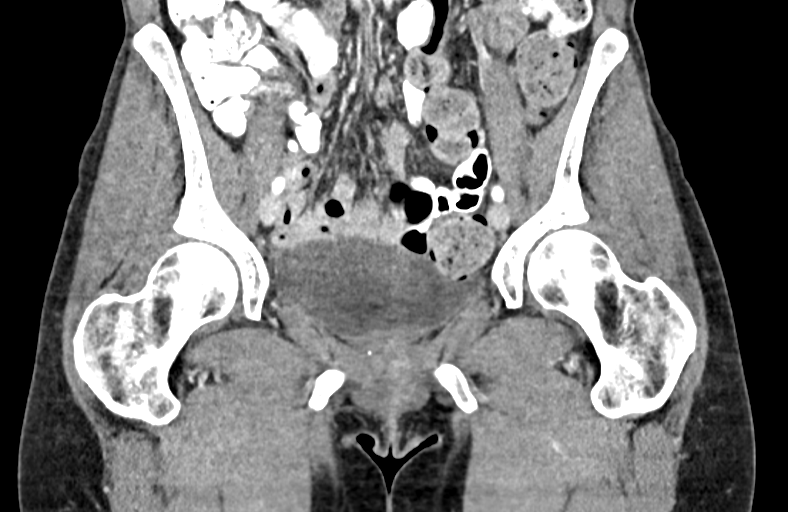
[im 68/123  soft-tissue]
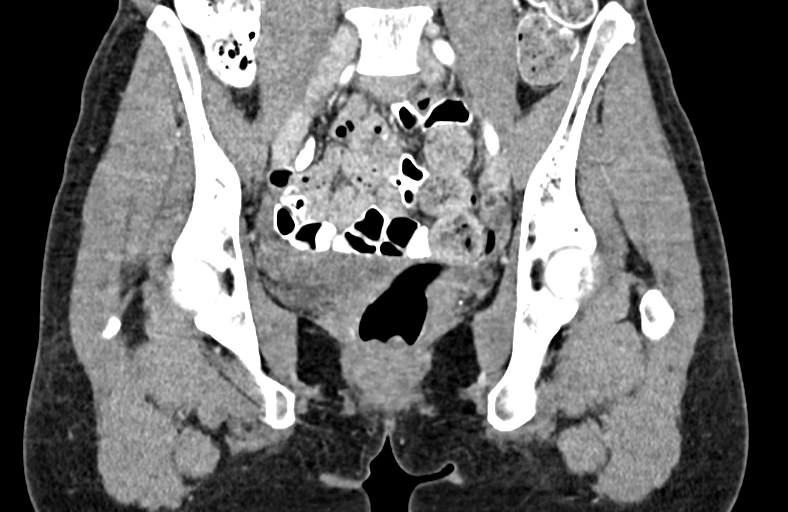

[10 of 46 positions shown; findings below may reference images not displayed]

FINDINGS: Urinary Tract:  All normal.

Bowel: Large stool burden without bowel obstruction or bowel wall
thickening. Normal appendix, axial image 16. A 270 degree rotation
of the mesenteric root vessels in the right hemipelvis without
mesenteric or bowel ischemia. Contrast enhancement of the small
bowel.

Vascular/Lymphatic: Benign-appearing bilateral inguinal lymph nodes.
Congenital moderate mass effect of the right common iliac artery on
the left common iliac vein. Minimal iliofemoral atherosclerotic
calcification.

Reproductive: Surgically absent uterus and ovaries. No adnexal cyst
or mass.

Other: Hernia plug repair of a right direct inguinal herniation
without recurrence hernia. The inguinal plug likely corresponds to
the prior ultrasound finding.

Musculoskeletal: Moderate degenerative change of the left hip with
small geodes in the superior acetabulum. Mild degenerative change of
the L4-S1 spine, bilateral sacroiliac joints, pubic symphysis and
right hip.
IMPRESSION: No acute CT finding in the pelvis.

Hernia plug repair of right direct inguinal herniation without
recurrence, the surgical plug corresponding to the prior ultrasound
abnormality.

Large stool burden without evidence of bowel obstruction.

ADDENDUM:
Original report by Dr. Jesus Leonel.  Addendum by Dr. Katjaana.

Additional review requested by Dr. Diani. Patient does not have
a history of prior right inguinal hernia repair.

Palpable abnormality corresponds to a small right femoral hernia
containing fat and trace fluid measuring up to 2.0 x 2.4 cm. No
evidence of bowel within the hernia. No associated inflammatory
changes to suggest incarceration.

*** End of Addendum ***
FINDINGS: Urinary Tract:  All normal.

Bowel: Large stool burden without bowel obstruction or bowel wall
thickening. Normal appendix, axial image 16. A 270 degree rotation
of the mesenteric root vessels in the right hemipelvis without
mesenteric or bowel ischemia. Contrast enhancement of the small
bowel.

Vascular/Lymphatic: Benign-appearing bilateral inguinal lymph nodes.
Congenital moderate mass effect of the right common iliac artery on
the left common iliac vein. Minimal iliofemoral atherosclerotic
calcification.

Reproductive: Surgically absent uterus and ovaries. No adnexal cyst
or mass.

Other: Hernia plug repair of a right direct inguinal herniation
without recurrence hernia. The inguinal plug likely corresponds to
the prior ultrasound finding.

Musculoskeletal: Moderate degenerative change of the left hip with
small geodes in the superior acetabulum. Mild degenerative change of
the L4-S1 spine, bilateral sacroiliac joints, pubic symphysis and
right hip.
IMPRESSION: No acute CT finding in the pelvis.

Hernia plug repair of right direct inguinal herniation without
recurrence, the surgical plug corresponding to the prior ultrasound
abnormality.

Large stool burden without evidence of bowel obstruction.

## 2021-09-14 NOTE — Patient Instructions (Addendum)
You had labs performed today.  You will be contacted with the results of the labs once they are available, usually in the next 3 business days for routine lab work.  If you have an active my chart account, they will be released to your MyChart.  If you prefer to have these labs released to you via telephone, please let us know.  Check into what kind of thyroid cancer your sister has.  Ask if YOU need any special evaluation for this type of cancer outside of normal thyroid checks.  Preventive Care 49-80 Years Old, Female Preventive care refers to lifestyle choices and visits with your health care provider that can promote health and wellness. Preventive care visits are also called wellness exams. What can I expect for my preventive care visit? Counseling Your health care provider may ask you questions about your: Medical history, including: Past medical problems. Family medical history. Pregnancy history. Current health, including: Menstrual cycle. Method of birth control. Emotional well-being. Home life and relationship well-being. Sexual activity and sexual health. Lifestyle, including: Alcohol, nicotine or tobacco, and drug use. Access to firearms. Diet, exercise, and sleep habits. Work and work Statistician. Sunscreen use. Safety issues such as seatbelt and bike helmet use. Physical exam Your health care provider will check your: Height and weight. These may be used to calculate your BMI (body mass index). BMI is a measurement that tells if you are at a healthy weight. Waist circumference. This measures the distance around your waistline. This measurement also tells if you are at a healthy weight and may help predict your risk of certain diseases, such as type 2 diabetes and high blood pressure. Heart rate and blood pressure. Body temperature. Skin for abnormal spots. What immunizations do I need?  Vaccines are usually given at various ages, according to a schedule. Your health  care provider will recommend vaccines for you based on your age, medical history, and lifestyle or other factors, such as travel or where you work. What tests do I need? Screening Your health care provider may recommend screening tests for certain conditions. This may include: Lipid and cholesterol levels. Diabetes screening. This is done by checking your blood sugar (glucose) after you have not eaten for a while (fasting). Pelvic exam and Pap test. Hepatitis B test. Hepatitis C test. HIV (human immunodeficiency virus) test. STI (sexually transmitted infection) testing, if you are at risk. Lung cancer screening. Colorectal cancer screening. Mammogram. Talk with your health care provider about when you should start having regular mammograms. This may depend on whether you have a family history of breast cancer. BRCA-related cancer screening. This may be done if you have a family history of breast, ovarian, tubal, or peritoneal cancers. Bone density scan. This is done to screen for osteoporosis. Talk with your health care provider about your test results, treatment options, and if necessary, the need for more tests. Follow these instructions at home: Eating and drinking  Eat a diet that includes fresh fruits and vegetables, whole grains, lean protein, and low-fat dairy products. Take vitamin and mineral supplements as recommended by your health care provider. Do not drink alcohol if: Your health care provider tells you not to drink. You are pregnant, may be pregnant, or are planning to become pregnant. If you drink alcohol: Limit how much you have to 0-1 drink a day. Know how much alcohol is in your drink. In the [place] one drink equals one 12 oz bottle of beer (355 mL), one 5 oz glass of  wine (148 mL), or one 1 oz glass of hard liquor (44 mL). Lifestyle Brush your teeth every morning and night with fluoride toothpaste. Floss one time each day. Exercise for at least 30 minutes 5 or more  days each week. Do not use any products that contain nicotine or tobacco. These products include cigarettes, chewing tobacco, and vaping devices, such as e-cigarettes. If you need help quitting, ask your health care provider. Do not use drugs. If you are sexually active, practice safe sex. Use a condom or other form of protection to prevent STIs. If you do not wish to become pregnant, use a form of birth control. If you plan to become pregnant, see your health care provider for a prepregnancy visit. Take aspirin only as told by your health care provider. Make sure that you understand how much to take and what form to take. Work with your health care provider to find out whether it is safe and beneficial for you to take aspirin daily. Find healthy ways to manage stress, such as: Meditation, yoga, or listening to music. Journaling. Talking to a trusted person. Spending time with friends and family. Minimize exposure to UV radiation to reduce your risk of skin cancer. Safety Always wear your seat belt while driving or riding in a vehicle. Do not drive: If you have been drinking alcohol. Do not ride with someone who has been drinking. When you are tired or distracted. While texting. If you have been using any mind-altering substances or drugs. Wear a helmet and other protective equipment during sports activities. If you have firearms in your house, make sure you follow all gun safety procedures. Seek help if you have been physically or sexually abused. What's next? Visit your health care provider once a year for an annual wellness visit. Ask your health care provider how often you should have your eyes and teeth checked. Stay up to date on all vaccines. This information is not intended to replace advice given to you by your health care provider. Make sure you discuss any questions you have with your health care provider. Document Revised: 08/31/2020 Document Reviewed: 08/31/2020 Elsevier  Patient Education  [place].

## 2021-09-15 ENCOUNTER — Encounter: Payer: Self-pay | Admitting: Family Medicine

## 2021-09-15 ENCOUNTER — Ambulatory Visit (INDEPENDENT_AMBULATORY_CARE_PROVIDER_SITE_OTHER): Payer: 59 | Admitting: Family Medicine

## 2021-09-15 VITALS — BP 122/71 | HR 72 | Temp 97.8°F | Ht 64.0 in | Wt 107.4 lb

## 2021-09-15 DIAGNOSIS — Z808 Family history of malignant neoplasm of other organs or systems: Secondary | ICD-10-CM

## 2021-09-15 DIAGNOSIS — E7800 Pure hypercholesterolemia, unspecified: Secondary | ICD-10-CM | POA: Diagnosis not present

## 2021-09-15 DIAGNOSIS — Z780 Asymptomatic menopausal state: Secondary | ICD-10-CM

## 2021-09-15 DIAGNOSIS — Z0001 Encounter for general adult medical examination with abnormal findings: Secondary | ICD-10-CM

## 2021-09-15 DIAGNOSIS — M8580 Other specified disorders of bone density and structure, unspecified site: Secondary | ICD-10-CM | POA: Diagnosis not present

## 2021-09-15 DIAGNOSIS — R8290 Unspecified abnormal findings in urine: Secondary | ICD-10-CM | POA: Diagnosis not present

## 2021-09-15 DIAGNOSIS — Z0000 Encounter for general adult medical examination without abnormal findings: Secondary | ICD-10-CM

## 2021-09-15 DIAGNOSIS — Z833 Family history of diabetes mellitus: Secondary | ICD-10-CM

## 2021-09-15 LAB — URINALYSIS, ROUTINE W REFLEX MICROSCOPIC
Bilirubin, UA: NEGATIVE
Glucose, UA: NEGATIVE
Ketones, UA: NEGATIVE
Leukocytes,UA: NEGATIVE
Nitrite, UA: NEGATIVE
Protein,UA: NEGATIVE
Specific Gravity, UA: 1.015 (ref 1.005–1.030)
Urobilinogen, Ur: 0.2 mg/dL (ref 0.2–1.0)
pH, UA: 7.5 (ref 5.0–7.5)

## 2021-09-15 LAB — MICROSCOPIC EXAMINATION
Bacteria, UA: NONE SEEN
Epithelial Cells (non renal): NONE SEEN /hpf (ref 0–10)
Renal Epithel, UA: NONE SEEN /hpf
WBC, UA: NONE SEEN /hpf (ref 0–5)

## 2021-09-15 NOTE — Progress Notes (Signed)
Sheila Patrick is a 61 y.o. female presents to office today for annual physical exam examination.    Concerns today include: 1.  Family history of thyroid cancer She reports me that her sister was recently diagnosed with metastatic thyroid cancer.  No known exposures.  She did work in [employer] and did unfortunately drink a lot of alcohol and smoke but she was not told by the sisters oncologist that she needed any special screening.  She reports would like her thyroid checked today.  Patient does not report any unplanned weight loss, tremor, difficulty swallowing or change in voice.  Sometimes she does suffer from sweating at night.  Diet: Balanced, Exercise: Regular Last eye exam: Up-to-date Last dental exam: Up-to-date Last colonoscopy: Up-to-date Last mammogram: Needs.  Going to see OB/GYN next week to discuss alternatives to cervical mammogram Last pap smear: History of hysterectomy. Refills needed today: N/A Immunizations needed: Immunization History  Administered Date(s) Administered   Influenza-Unspecified 12/17/2017    Past Medical History:  Diagnosis Date   ARTHRITIS, CERVICAL SPINE 04/11/2009   Qualifier: Diagnosis of  By: Aline Brochure MD, [place]     Back pain    Neck pain    PONV (postoperative nausea and vomiting)    Shoulder pain    Vaginal pain 04/10/2017   Social History   Socioeconomic History   Marital status: Single    Spouse name: Not on file   Number of children: 0   Years of education: Not on file   Highest education level: Not on file  Occupational History   Occupation: administrative assitant  Tobacco Use   Smoking status: Never   Smokeless tobacco: Never  Vaping Use   Vaping Use: Never used  Substance and Sexual Activity   Alcohol use: Not Currently    Comment: occas   Drug use: No   Sexual activity: Not Currently    Comment: single  Other Topics Concern   Not on file  Social History Narrative   Not on file   Social Determinants of  Health   Financial Resource Strain: Not on file  Food Insecurity: Not on file  Transportation Needs: Not on file  Physical Activity: Not on file  Stress: Not on file  Social Connections: Not on file  Intimate Partner Violence: Not on file   Past Surgical History:  Procedure Laterality Date   ABDOMINAL HYSTERECTOMY     Total abdominal   COLONOSCOPY N/A 01/21/2019   Procedure: COLONOSCOPY;  Surgeon: Rogene Houston, MD;  Location: [facility];  Service: Endoscopy;  Laterality: N/A;  [place]   right breast lumpectomy  2002   VESICOVAGINAL FISTULA CLOSURE W/ TAH  1998   Family History  Problem Relation Age of Onset   Asthma Mother    Cancer Father        lung   Lung cancer Father    Asthma Sister    Hypertension Sister    Alcohol abuse Sister    Thyroid cancer Sister    COPD Sister    Heart disease Sister    Hyperlipidemia Sister    Hypertension Sister    Diabetes Sister    Heart attack Sister 25   Cancer Brother        ?laryngeal   Cirrhosis Brother    Alcohol abuse Brother     Current Outpatient Medications:    b complex vitamins tablet, Take 1 tablet by mouth every other day., Disp: , Rfl:  BLACK CURRANT SEED OIL PO, Take by mouth., Disp: , Rfl:    cholecalciferol (VITAMIN D) 1000 units tablet, Take 1,000 Units by mouth every other day. With K2, Disp: , Rfl:    Cod Liver Oil 1000 MG CAPS, Take by mouth., Disp: , Rfl:    meclizine (ANTIVERT) 25 MG tablet, Take 1 tablet (25 mg total) by mouth 3 (three) times daily as needed for dizziness., Disp: 30 tablet, Rfl: 0   Omega-3 Fatty Acids (FISH OIL) 1000 MG CAPS, Take by mouth., Disp: , Rfl:    cetirizine (ZYRTEC) 10 MG chewable tablet, Chew 10 mg by mouth as needed for allergies.  (Patient not taking: Reported on 09/15/2021), Disp: , Rfl:   Allergies  Allergen Reactions   Other Other (See Comments)    Dye (unknown)     ROS: Review of Systems A comprehensive review of systems was negative  except for: Integument/breast: positive for nipple inversion on the left Behavioral/Psych: positive for sexual difficulty    Physical exam BP 122/71   Pulse 72   Temp 97.8 F (36.6 C)   Ht '5\' 4"'  (1.626 m)   Wt 107 lb 6.4 oz (48.7 kg)   SpO2 100%   BMI 18.44 kg/m  General appearance: alert, cooperative, appears stated age, and no distress Head: Normocephalic, without obvious abnormality, atraumatic Eyes: negative findings: lids and lashes normal, conjunctivae and sclerae normal, corneas clear, and pupils equal, round, reactive to light and accomodation Ears: normal TM's and external ear canals both ears Nose: Nares normal. Septum midline. Mucosa normal. No drainage or sinus tenderness. Throat: lips, mucosa, and tongue normal; teeth and gums normal Neck: no adenopathy, no carotid bruit, supple, symmetrical, trachea midline, and thyroid not enlarged, symmetric, no tenderness/mass/nodules Back: symmetric, no curvature. ROM normal. No CVA tenderness. Lungs: clear to auscultation bilaterally Heart: regular rate and rhythm, S1, S2 normal, no murmur, click, rub or gallop Abdomen: soft, non-tender; bowel sounds normal; no masses,  no organomegaly Extremities: extremities normal, atraumatic, no cyanosis or edema Pulses: 2+ and symmetric Skin: Skin color, texture, turgor normal. No rashes or lesions Lymph nodes: Cervical, supraclavicular, and axillary nodes normal. Neurologic: Grossly normal Psych: Mood stable, speech normal, affect appropriate.  Very pleasant and interactive     09/15/2021    8:56 AM 11/29/2020    4:18 PM 09/13/2020    3:10 PM  Depression screen PHQ 2/9  Decreased Interest 0 0 0  Down, Depressed, Hopeless 0 0 0  PHQ - 2 Score 0 0 0      09/15/2021    8:56 AM 11/29/2020    4:19 PM 09/13/2020    3:10 PM  GAD 7 : Generalized Anxiety Score  Nervous, Anxious, on Edge 0 0 0  Control/stop worrying 0 0 0  Worry too much - different things 0 0 0  Trouble relaxing 0 0 0   Restless 0 0 0  Easily annoyed or irritable 0 0 0  Afraid - awful might happen 0 0 0  Total GAD 7 Score 0 0 0  Anxiety Difficulty Not difficult at all Not difficult at all Not difficult at all    Assessment/ Plan: Sheila Patrick here for annual physical exam.   Annual physical exam  Abnormal urinalysis - Plan: Urinalysis, Routine w reflex microscopic, Urinalysis, Routine w reflex microscopic  Osteopenia after menopause - Plan: CMP14+EGFR, CBC, VITAMIN D 25 Hydroxy (Vit-D Deficiency, Fractures)  Pure hypercholesterolemia - Plan: CMP14+EGFR, Lipid Panel, TSH  Family history of thyroid cancer -  Plan: CMP14+EGFR, TSH, T4, Free  Family history of diabetes mellitus (DM) - Plan: Insulin, random  Urinalysis had trace blood but upon microscopy there is no significant RBCs noted.  This is something we can continue to monitor but I suspect this is myoglobin not hemoglobin that we are seeing in her urine dips  Check vitamin D, CBC, CMP given known osteopenia.  Continue weightbearing exercise, adequate intake of vitamin D and calcium in diet  Fasting lipid, CMP and TSH were ordered as well as free T4 given family history of this thyroid cancer.  Fortunately no special testing that we can do to actually "screen".  However, if there is some type of genetic component or any recommendations outlined by her sisters oncologist, I am glad to further pursue this.  Her thyroid exam is unremarkable today demonstrated no nodularity or enlargement today.  She requested brand of insulin given family history of diabetes and I am glad to accommodate this though I have very low suspicion of her having any type of diabetic process going on given normal blood sugars noted on previous lab check.  Patient to follow up in 1 year for annual exam or sooner if needed.  Jalie Eiland M. Lajuana Ripple, DO

## 2021-09-19 LAB — CMP14+EGFR
ALT: 14 IU/L (ref 0–32)
AST: 14 IU/L (ref 0–40)
Albumin/Globulin Ratio: 1.8 (ref 1.2–2.2)
Albumin: 4.4 g/dL (ref 3.8–4.8)
Alkaline Phosphatase: 59 IU/L (ref 44–121)
BUN/Creatinine Ratio: 18 (ref 12–28)
BUN: 13 mg/dL (ref 8–27)
Bilirubin Total: 0.5 mg/dL (ref 0.0–1.2)
CO2: 24 mmol/L (ref 20–29)
Calcium: 9.5 mg/dL (ref 8.7–10.3)
Chloride: 98 mmol/L (ref 96–106)
Creatinine, Ser: 0.74 mg/dL (ref 0.57–1.00)
Globulin, Total: 2.4 g/dL (ref 1.5–4.5)
Glucose: 94 mg/dL (ref 70–99)
Potassium: 3.9 mmol/L (ref 3.5–5.2)
Sodium: 136 mmol/L (ref 134–144)
Total Protein: 6.8 g/dL (ref 6.0–8.5)
eGFR: 91 mL/min/{1.73_m2} (ref >59)

## 2021-09-19 LAB — CBC
Hematocrit: 35.3 % (ref 34.0–46.6)
Hemoglobin: 13 g/dL (ref 11.1–15.9)
MCH: 32.5 pg (ref 26.6–33.0)
MCHC: 36.8 g/dL — ABNORMAL HIGH (ref 31.5–35.7)
MCV: 88 fL (ref 79–97)
Platelets: 276 10*3/uL (ref 150–450)
RBC: 4 x10E6/uL (ref 3.77–5.28)
RDW: 13.1 % (ref 11.7–15.4)
WBC: 4 10*3/uL (ref 3.4–10.8)

## 2021-09-19 LAB — LIPID PANEL
Chol/HDL Ratio: 3 ratio (ref 0.0–4.4)
Cholesterol, Total: 264 mg/dL — ABNORMAL HIGH (ref 100–199)
HDL: 89 mg/dL (ref >39)
LDL Chol Calc (NIH): 168 mg/dL — ABNORMAL HIGH (ref 0–99)
Triglycerides: 46 mg/dL (ref 0–149)
VLDL Cholesterol Cal: 7 mg/dL (ref 5–40)

## 2021-09-19 LAB — T4, FREE: Free T4: 1.14 ng/dL (ref 0.82–1.77)

## 2021-09-19 LAB — INSULIN, RANDOM: INSULIN: 3.8 u[IU]/mL (ref 2.6–24.9)

## 2021-09-19 LAB — VITAMIN D 25 HYDROXY (VIT D DEFICIENCY, FRACTURES): Vit D, 25-Hydroxy: 149 ng/mL — ABNORMAL HIGH (ref 30.0–100.0)

## 2021-09-19 LAB — TSH: TSH: 1.19 u[IU]/mL (ref 0.450–4.500)

## 2021-10-02 DIAGNOSIS — Z1389 Encounter for screening for other disorder: Secondary | ICD-10-CM | POA: Diagnosis not present

## 2021-10-02 DIAGNOSIS — Z01419 Encounter for gynecological examination (general) (routine) without abnormal findings: Secondary | ICD-10-CM | POA: Diagnosis not present

## 2021-10-18 ENCOUNTER — Other Ambulatory Visit: Payer: Self-pay | Admitting: Obstetrics and Gynecology

## 2021-10-18 DIAGNOSIS — R921 Mammographic calcification found on diagnostic imaging of breast: Secondary | ICD-10-CM

## 2021-11-23 ENCOUNTER — Telehealth: Payer: Self-pay | Admitting: Family Medicine

## 2021-11-23 DIAGNOSIS — R799 Abnormal finding of blood chemistry, unspecified: Secondary | ICD-10-CM

## 2021-11-23 DIAGNOSIS — R350 Frequency of micturition: Secondary | ICD-10-CM

## 2021-11-24 ENCOUNTER — Encounter: Payer: Self-pay | Admitting: Family Medicine

## 2021-11-24 NOTE — Telephone Encounter (Signed)
The literature does NOT show that any of these are hereditary so no further workup needed but we shall certainly keep an eye on her labs yearly to ensure that nothing shows abnormality.

## 2021-11-27 NOTE — Telephone Encounter (Signed)
Pt aware of labs- wants to recheck her labs since her cholesterol was high- I let pt know typically we only check once a year but pt is requesting repeat of labs and urine so I have placed those for her.

## 2022-01-02 ENCOUNTER — Other Ambulatory Visit: Payer: 59

## 2022-01-02 DIAGNOSIS — R799 Abnormal finding of blood chemistry, unspecified: Secondary | ICD-10-CM

## 2022-01-02 DIAGNOSIS — R350 Frequency of micturition: Secondary | ICD-10-CM | POA: Diagnosis not present

## 2022-01-02 LAB — MICROSCOPIC EXAMINATION
Bacteria, UA: NONE SEEN
Epithelial Cells (non renal): NONE SEEN /hpf (ref 0–10)
Renal Epithel, UA: NONE SEEN /hpf

## 2022-01-02 LAB — URINALYSIS, ROUTINE W REFLEX MICROSCOPIC
Bilirubin, UA: NEGATIVE
Glucose, UA: NEGATIVE
Ketones, UA: NEGATIVE
Leukocytes,UA: NEGATIVE
Nitrite, UA: NEGATIVE
Protein,UA: NEGATIVE
Specific Gravity, UA: 1.015 (ref 1.005–1.030)
Urobilinogen, Ur: 0.2 mg/dL (ref 0.2–1.0)
pH, UA: 7 (ref 5.0–7.5)

## 2022-01-03 LAB — CBC WITH DIFFERENTIAL/PLATELET
Basophils Absolute: 0.1 10*3/uL (ref 0.0–0.2)
Basos: 1 %
EOS (ABSOLUTE): 0.1 10*3/uL (ref 0.0–0.4)
Eos: 3 %
Hematocrit: 36.5 % (ref 34.0–46.6)
Hemoglobin: 12.4 g/dL (ref 11.1–15.9)
Immature Grans (Abs): 0 10*3/uL (ref 0.0–0.1)
Immature Granulocytes: 0 %
Lymphocytes Absolute: 2.2 10*3/uL (ref 0.7–3.1)
Lymphs: 43 %
MCH: 30.4 pg (ref 26.6–33.0)
MCHC: 34 g/dL (ref 31.5–35.7)
MCV: 90 fL (ref 79–97)
Monocytes Absolute: 0.3 10*3/uL (ref 0.1–0.9)
Monocytes: 7 %
Neutrophils Absolute: 2.4 10*3/uL (ref 1.4–7.0)
Neutrophils: 46 %
Platelets: 282 10*3/uL (ref 150–450)
RBC: 4.08 x10E6/uL (ref 3.77–5.28)
RDW: 13 % (ref 11.7–15.4)
WBC: 5.1 10*3/uL (ref 3.4–10.8)

## 2022-01-03 LAB — LIPID PANEL
Chol/HDL Ratio: 3.1 ratio (ref 0.0–4.4)
Cholesterol, Total: 258 mg/dL — ABNORMAL HIGH (ref 100–199)
HDL: 84 mg/dL (ref >39)
LDL Chol Calc (NIH): 167 mg/dL — ABNORMAL HIGH (ref 0–99)
Triglycerides: 47 mg/dL (ref 0–149)
VLDL Cholesterol Cal: 7 mg/dL (ref 5–40)

## 2022-03-16 ENCOUNTER — Telehealth: Payer: Self-pay | Admitting: *Deleted

## 2022-03-16 NOTE — Telephone Encounter (Signed)
Patient called in with complaint of left arm pain. Reports not constant and hurts when moved a certain way. Reports she did do some raking of leaves yesterday. Also reports some neck and jaw pain last week but none at this time and reports increased stress with sister passing.   Scheduled patient with PCP first available (03/20/22) and recommended eval at Urgent Care or ER in symptom persist or worsen before then.  Patient agreeable.

## 2022-03-20 ENCOUNTER — Encounter: Payer: Self-pay | Admitting: Family Medicine

## 2022-03-20 ENCOUNTER — Ambulatory Visit: Payer: 59 | Admitting: Family Medicine

## 2022-03-20 ENCOUNTER — Ambulatory Visit (INDEPENDENT_AMBULATORY_CARE_PROVIDER_SITE_OTHER): Payer: 59 | Admitting: Family Medicine

## 2022-03-20 VITALS — BP 138/72 | HR 70 | Temp 97.5°F | Ht 64.0 in | Wt 109.0 lb

## 2022-03-20 DIAGNOSIS — M779 Enthesopathy, unspecified: Secondary | ICD-10-CM | POA: Diagnosis not present

## 2022-03-20 DIAGNOSIS — E7800 Pure hypercholesterolemia, unspecified: Secondary | ICD-10-CM

## 2022-03-20 NOTE — Patient Instructions (Signed)
Fatigue If you have fatigue, you feel tired all the time and have a lack of energy or a lack of motivation. Fatigue may make it difficult to start or complete tasks because of exhaustion. Occasional or mild fatigue is often a normal response to activity or life. However, long-term (chronic) or extreme fatigue may be a symptom of a medical condition such as: Depression. Not having enough red blood cells or hemoglobin in the blood (anemia). A problem with a small gland located in the lower front part of the neck (thyroid disorder). Rheumatologic conditions. These are problems related to the body's defense system (immune system). Infections, especially certain viral infections. Fatigue can also lead to negative health outcomes over time. Follow these instructions at home: Medicines Take over-the-counter and prescription medicines only as told by your health care provider. Take a multivitamin if told by your health care provider. Do not use herbal or dietary supplements unless they are approved by your health care provider. Eating and drinking  Avoid heavy meals in the evening. Eat a well-balanced diet, which includes lean proteins, whole grains, plenty of fruits and vegetables, and low-fat dairy products. Avoid eating or drinking too many products with caffeine in them. Avoid alcohol. Drink enough fluid to keep your urine pale yellow. Activity  Exercise regularly, as told by your health care provider. Use or practice techniques to help you relax, such as yoga, tai chi, meditation, or massage therapy. Lifestyle Change situations that cause you stress. Try to keep your work and personal schedules in balance. Do not use recreational or illegal drugs. General instructions Monitor your fatigue for any changes. Go to bed and get up at the same time every day. Avoid fatigue by pacing yourself during the day and getting enough sleep at night. Maintain a healthy weight. Contact a health care  provider if: Your fatigue does not get better. You have a fever. You suddenly lose or gain weight. You have headaches. You have trouble falling asleep or sleeping through the night. You feel angry, guilty, anxious, or sad. You have swelling in your legs or another part of your body. Get help right away if: You feel confused, feel like you might faint, or faint. Your vision is blurry or you have a severe headache. You have severe pain in your abdomen, your back, or the area between your waist and hips (pelvis). You have chest pain, shortness of breath, or an irregular or fast heartbeat. You are unable to urinate, or you urinate less than normal. You have abnormal bleeding from the rectum, nose, lungs, nipples, or, if you are female, the vagina. You vomit blood. You have thoughts about hurting yourself or others. These symptoms may be an emergency. Get help right away. Call 911. Do not wait to see if the symptoms will go away. Do not drive yourself to the hospital. Get help right away if you feel like you may hurt yourself or others, or have thoughts about taking your own life. Go to your nearest emergency room or: Call 911. Call the National Suicide Prevention Lifeline at 1-800-273-8255 or 988. This is open 24 hours a day. Text the Crisis Text Line at 741741. Summary If you have fatigue, you feel tired all the time and have a lack of energy or a lack of motivation. Fatigue may make it difficult to start or complete tasks because of exhaustion. Long-term (chronic) or extreme fatigue may be a symptom of a medical condition. Exercise regularly, as told by your health care provider.   Change situations that cause you stress. Try to keep your work and personal schedules in balance. This information is not intended to replace advice given to you by your health care provider. Make sure you discuss any questions you have with your health care provider. Document Revised: 12/26/2020 Document  Reviewed: 12/26/2020 Elsevier Patient Education  2023 [employer] Inc.  

## 2022-03-20 NOTE — Progress Notes (Signed)
Subjective: CC: Left shoulder pain PCP: [name], DO GUR:[facility] Sheila Patrick is a 62 y.o. female presenting to clinic today for:  1.  Left shoulder pain Patient reports that she has been experiencing left arm pain for about 2 to 3 weeks.  She points to the area of her deltoid as the area of pain.  She denies any preceding injury but notes that she often does repetitive movements and may have had an injury sustained as result of cleaning her yard.  She unfortunately also suffered from bilateral jaw and neck pain around this time and thought that she may have been having a heart attack.  She was evaluated in urgent care and there was no evidence of abnormality on EKG but no troponins were collected.  She has not had recurrent pain in the jaw or neck but notes it was quite severe at some point.  She thinks this may have been secondary to some dental work and having her hair done but given the constellation of symptoms she wanted to err on the side of caution.  She is interested in pursuing a coronary artery calcium score test.  She has history of hyperlipidemia.  Continues to exercise regularly and eat a balanced diet   ROS: Per HPI  Allergies  Allergen Reactions   Other Other (See Comments)    Dye (unknown)   Past Medical History:  Diagnosis Date   ARTHRITIS, CERVICAL SPINE 04/11/2009   Qualifier: Diagnosis of  By: Aline Brochure MD, [place]     Back pain    Neck pain    PONV (postoperative nausea and vomiting)    Shoulder pain    Vaginal pain 04/10/2017    Current Outpatient Medications:    b complex vitamins tablet, Take 1 tablet by mouth every other day., Disp: , Rfl:    BLACK CURRANT SEED OIL PO, Take by mouth., Disp: , Rfl:    cetirizine (ZYRTEC) 10 MG chewable tablet, Chew 10 mg by mouth as needed for allergies., Disp: , Rfl:    cholecalciferol (VITAMIN D) 1000 units tablet, Take 1,000 Units by mouth every other day. With K2, Disp: , Rfl:    Cod Liver Oil 1000 MG CAPS, Take  by mouth., Disp: , Rfl:    cyclobenzaprine (FLEXERIL) 10 MG tablet, Take 10 mg by mouth 3 (three) times daily., Disp: , Rfl:    ketorolac (TORADOL) 10 MG tablet, Take 10 mg by mouth 3 (three) times daily., Disp: , Rfl:    meclizine (ANTIVERT) 25 MG tablet, Take 1 tablet (25 mg total) by mouth 3 (three) times daily as needed for dizziness., Disp: 30 tablet, Rfl: 0   Omega-3 Fatty Acids (FISH OIL) 1000 MG CAPS, Take by mouth., Disp: , Rfl:  Social History   Socioeconomic History   Marital status: Single    Spouse name: Not on file   Number of children: 0   Years of education: Not on file   Highest education level: Not on file  Occupational History   Occupation: administrative assitant  Tobacco Use   Smoking status: Never   Smokeless tobacco: Never  Vaping Use   Vaping Use: Never used  Substance and Sexual Activity   Alcohol use: Not Currently    Comment: occas   Drug use: No   Sexual activity: Not Currently    Comment: single  Other Topics Concern   Not on file  Social History Narrative   Not on file   Social Determinants of Health  Financial Resource Strain: Not on file  Food Insecurity: Not on file  Transportation Needs: Not on file  Physical Activity: Not on file  Stress: Not on file  Social Connections: Not on file  Intimate Partner Violence: Not on file   Family History  Problem Relation Age of Onset   Asthma Mother    Cancer Father        lung   Lung cancer Father    Asthma Sister    Hypertension Sister    Alcohol abuse Sister    Thyroid cancer Sister    COPD Sister    Heart disease Sister    Hyperlipidemia Sister    Hypertension Sister    Diabetes Sister    Heart attack Sister 52   Cancer Brother        ?laryngeal   Cirrhosis Brother    Alcohol abuse Brother     Objective: Office vital signs reviewed. BP 138/72   Pulse 70   Temp (!) 97.5 F (36.4 C) (Temporal)   Ht 5\' 4"  (1.626 m)   Wt 109 lb (49.4 kg)   SpO2 100%   BMI 18.71 kg/m    Physical Examination:  General: Awake, alert, well nourished, No acute distress MSK:  Left shoulder: No gross deformity.  She has fairly preserved active range of motion except for an internal rotation.  This is limited secondary to pain and stiffness.  She does not have tenderness over the area of concern which is approximately at the insertion of the deltoid/biceps area anteriorly.  There are no palpable deformities.  Negative Hawking's test.  Negative empty can test.  Assessment/ Plan: 62 y.o. female   Tendonitis  Pure hypercholesterolemia - Plan: CT CARDIAC SCORING (SELF PAY ONLY)  Suspect tendinitis in the biceps/deltoid region.  I have given her some home physical therapy exercises specifically for biceps tendinitis.  We discussed if no significant improvement we could refer to physical therapy next-door or to an orthopedist to further evaluate.  There certainly was not any evidence of rotator cuff tear.  Okay to use topical analgesic of choice.  Cannot take oral anti-inflammatories she feels comfortable with this.  She wanted to have cardiac screening so CT coronary artery screening ordered.  Indication is hyperlipidemia.  She is very physically fit and leads a very healthy lifestyle so I am not sure that there would be any room for improvement from a lifestyle standpoint if in fact she is found to have higher CAC.  Would certainly recommend starting oral medications if this is found  No orders of the defined types were placed in this encounter.  No orders of the defined types were placed in this encounter.    [name], DO [place] [phone]

## 2022-03-28 ENCOUNTER — Telehealth: Payer: Self-pay | Admitting: Family Medicine

## 2022-04-03 ENCOUNTER — Other Ambulatory Visit (HOSPITAL_COMMUNITY): Payer: Self-pay

## 2022-04-11 ENCOUNTER — Telehealth: Payer: Self-pay | Admitting: Family Medicine

## 2022-04-11 NOTE — Telephone Encounter (Signed)
Thank you for update. Please follow up if still not improving and we can get her referred to a specialist.

## 2022-04-11 NOTE — Telephone Encounter (Signed)
Attempted to call pt , no answer left vm  

## 2022-04-11 NOTE — Telephone Encounter (Signed)
Pt called to let PCP know that her arm is still hurting and that she is looking in to seeing a chiropractor.

## 2022-04-18 DIAGNOSIS — S43422A Sprain of left rotator cuff capsule, initial encounter: Secondary | ICD-10-CM | POA: Diagnosis not present

## 2022-04-18 DIAGNOSIS — S134XXA Sprain of ligaments of cervical spine, initial encounter: Secondary | ICD-10-CM | POA: Diagnosis not present

## 2022-04-20 DIAGNOSIS — S43422A Sprain of left rotator cuff capsule, initial encounter: Secondary | ICD-10-CM | POA: Diagnosis not present

## 2022-04-20 DIAGNOSIS — S134XXA Sprain of ligaments of cervical spine, initial encounter: Secondary | ICD-10-CM | POA: Diagnosis not present

## 2022-04-23 ENCOUNTER — Encounter: Payer: Self-pay | Admitting: *Deleted

## 2022-04-23 DIAGNOSIS — S43422A Sprain of left rotator cuff capsule, initial encounter: Secondary | ICD-10-CM | POA: Diagnosis not present

## 2022-04-23 DIAGNOSIS — S134XXA Sprain of ligaments of cervical spine, initial encounter: Secondary | ICD-10-CM | POA: Diagnosis not present

## 2022-04-24 NOTE — Telephone Encounter (Signed)
Contacted, Patient states arm is improved with chiropractic visits.  No need for referral at this time.

## 2022-04-27 DIAGNOSIS — S43422A Sprain of left rotator cuff capsule, initial encounter: Secondary | ICD-10-CM | POA: Diagnosis not present

## 2022-04-27 DIAGNOSIS — S134XXA Sprain of ligaments of cervical spine, initial encounter: Secondary | ICD-10-CM | POA: Diagnosis not present

## 2022-05-01 DIAGNOSIS — S134XXA Sprain of ligaments of cervical spine, initial encounter: Secondary | ICD-10-CM | POA: Diagnosis not present

## 2022-05-01 DIAGNOSIS — S43422A Sprain of left rotator cuff capsule, initial encounter: Secondary | ICD-10-CM | POA: Diagnosis not present

## 2022-05-04 DIAGNOSIS — S134XXA Sprain of ligaments of cervical spine, initial encounter: Secondary | ICD-10-CM | POA: Diagnosis not present

## 2022-05-04 DIAGNOSIS — S43422A Sprain of left rotator cuff capsule, initial encounter: Secondary | ICD-10-CM | POA: Diagnosis not present

## 2022-05-08 DIAGNOSIS — S134XXA Sprain of ligaments of cervical spine, initial encounter: Secondary | ICD-10-CM | POA: Diagnosis not present

## 2022-05-08 DIAGNOSIS — S43422A Sprain of left rotator cuff capsule, initial encounter: Secondary | ICD-10-CM | POA: Diagnosis not present

## 2022-05-11 DIAGNOSIS — S43422A Sprain of left rotator cuff capsule, initial encounter: Secondary | ICD-10-CM | POA: Diagnosis not present

## 2022-05-11 DIAGNOSIS — S134XXA Sprain of ligaments of cervical spine, initial encounter: Secondary | ICD-10-CM | POA: Diagnosis not present

## 2022-05-14 DIAGNOSIS — S43422A Sprain of left rotator cuff capsule, initial encounter: Secondary | ICD-10-CM | POA: Diagnosis not present

## 2022-05-14 DIAGNOSIS — S134XXA Sprain of ligaments of cervical spine, initial encounter: Secondary | ICD-10-CM | POA: Diagnosis not present

## 2022-05-17 DIAGNOSIS — S43422A Sprain of left rotator cuff capsule, initial encounter: Secondary | ICD-10-CM | POA: Diagnosis not present

## 2022-05-17 DIAGNOSIS — S134XXA Sprain of ligaments of cervical spine, initial encounter: Secondary | ICD-10-CM | POA: Diagnosis not present

## 2022-05-31 DIAGNOSIS — S43422A Sprain of left rotator cuff capsule, initial encounter: Secondary | ICD-10-CM | POA: Diagnosis not present

## 2022-05-31 DIAGNOSIS — S134XXA Sprain of ligaments of cervical spine, initial encounter: Secondary | ICD-10-CM | POA: Diagnosis not present

## 2022-06-05 DIAGNOSIS — S134XXA Sprain of ligaments of cervical spine, initial encounter: Secondary | ICD-10-CM | POA: Diagnosis not present

## 2022-06-05 DIAGNOSIS — S43422A Sprain of left rotator cuff capsule, initial encounter: Secondary | ICD-10-CM | POA: Diagnosis not present

## 2022-06-06 DIAGNOSIS — Z811 Family history of alcohol abuse and dependence: Secondary | ICD-10-CM | POA: Diagnosis not present

## 2022-06-06 DIAGNOSIS — Z8249 Family history of ischemic heart disease and other diseases of the circulatory system: Secondary | ICD-10-CM | POA: Diagnosis not present

## 2022-06-06 DIAGNOSIS — Z803 Family history of malignant neoplasm of breast: Secondary | ICD-10-CM | POA: Diagnosis not present

## 2022-06-06 DIAGNOSIS — Z825 Family history of asthma and other chronic lower respiratory diseases: Secondary | ICD-10-CM | POA: Diagnosis not present

## 2022-06-13 DIAGNOSIS — S43422A Sprain of left rotator cuff capsule, initial encounter: Secondary | ICD-10-CM | POA: Diagnosis not present

## 2022-06-13 DIAGNOSIS — S134XXA Sprain of ligaments of cervical spine, initial encounter: Secondary | ICD-10-CM | POA: Diagnosis not present

## 2022-06-20 DIAGNOSIS — S134XXA Sprain of ligaments of cervical spine, initial encounter: Secondary | ICD-10-CM | POA: Diagnosis not present

## 2022-06-20 DIAGNOSIS — S43422A Sprain of left rotator cuff capsule, initial encounter: Secondary | ICD-10-CM | POA: Diagnosis not present

## 2022-06-27 DIAGNOSIS — S134XXA Sprain of ligaments of cervical spine, initial encounter: Secondary | ICD-10-CM | POA: Diagnosis not present

## 2022-06-27 DIAGNOSIS — S43422A Sprain of left rotator cuff capsule, initial encounter: Secondary | ICD-10-CM | POA: Diagnosis not present

## 2022-07-04 DIAGNOSIS — S43422A Sprain of left rotator cuff capsule, initial encounter: Secondary | ICD-10-CM | POA: Diagnosis not present

## 2022-07-04 DIAGNOSIS — S134XXA Sprain of ligaments of cervical spine, initial encounter: Secondary | ICD-10-CM | POA: Diagnosis not present

## 2022-07-06 DIAGNOSIS — S134XXA Sprain of ligaments of cervical spine, initial encounter: Secondary | ICD-10-CM | POA: Diagnosis not present

## 2022-07-06 DIAGNOSIS — S43422A Sprain of left rotator cuff capsule, initial encounter: Secondary | ICD-10-CM | POA: Diagnosis not present

## 2022-07-10 DIAGNOSIS — S134XXA Sprain of ligaments of cervical spine, initial encounter: Secondary | ICD-10-CM | POA: Diagnosis not present

## 2022-07-10 DIAGNOSIS — S43422A Sprain of left rotator cuff capsule, initial encounter: Secondary | ICD-10-CM | POA: Diagnosis not present

## 2022-07-17 DIAGNOSIS — S43422A Sprain of left rotator cuff capsule, initial encounter: Secondary | ICD-10-CM | POA: Diagnosis not present

## 2022-07-17 DIAGNOSIS — S134XXA Sprain of ligaments of cervical spine, initial encounter: Secondary | ICD-10-CM | POA: Diagnosis not present

## 2022-07-24 DIAGNOSIS — S134XXA Sprain of ligaments of cervical spine, initial encounter: Secondary | ICD-10-CM | POA: Diagnosis not present

## 2022-07-24 DIAGNOSIS — S43422A Sprain of left rotator cuff capsule, initial encounter: Secondary | ICD-10-CM | POA: Diagnosis not present

## 2022-07-31 DIAGNOSIS — S43422A Sprain of left rotator cuff capsule, initial encounter: Secondary | ICD-10-CM | POA: Diagnosis not present

## 2022-07-31 DIAGNOSIS — S134XXA Sprain of ligaments of cervical spine, initial encounter: Secondary | ICD-10-CM | POA: Diagnosis not present

## 2022-08-07 DIAGNOSIS — S43422A Sprain of left rotator cuff capsule, initial encounter: Secondary | ICD-10-CM | POA: Diagnosis not present

## 2022-08-07 DIAGNOSIS — S134XXA Sprain of ligaments of cervical spine, initial encounter: Secondary | ICD-10-CM | POA: Diagnosis not present

## 2022-08-14 DIAGNOSIS — S43422A Sprain of left rotator cuff capsule, initial encounter: Secondary | ICD-10-CM | POA: Diagnosis not present

## 2022-08-14 DIAGNOSIS — S134XXA Sprain of ligaments of cervical spine, initial encounter: Secondary | ICD-10-CM | POA: Diagnosis not present

## 2022-08-21 DIAGNOSIS — S134XXA Sprain of ligaments of cervical spine, initial encounter: Secondary | ICD-10-CM | POA: Diagnosis not present

## 2022-08-21 DIAGNOSIS — S43422A Sprain of left rotator cuff capsule, initial encounter: Secondary | ICD-10-CM | POA: Diagnosis not present

## 2022-08-28 DIAGNOSIS — S43422A Sprain of left rotator cuff capsule, initial encounter: Secondary | ICD-10-CM | POA: Diagnosis not present

## 2022-08-28 DIAGNOSIS — S134XXA Sprain of ligaments of cervical spine, initial encounter: Secondary | ICD-10-CM | POA: Diagnosis not present

## 2022-09-03 NOTE — Patient Instructions (Signed)
Our records indicate that you are due for your screening mammogram.  Please call the imaging center that does your yearly mammograms to make an appointment for a mammogram at your earliest convenience. Our office also has a mobile unit through the [facility] that comes to our location. Please call our office if you would like to make an appointment.   

## 2022-09-04 ENCOUNTER — Ambulatory Visit (INDEPENDENT_AMBULATORY_CARE_PROVIDER_SITE_OTHER): Payer: 59 | Admitting: Family Medicine

## 2022-09-04 ENCOUNTER — Encounter: Payer: Self-pay | Admitting: Family Medicine

## 2022-09-04 VITALS — BP 119/71 | HR 61 | Temp 98.3°F | Ht 63.0 in | Wt 106.0 lb

## 2022-09-04 DIAGNOSIS — R7989 Other specified abnormal findings of blood chemistry: Secondary | ICD-10-CM | POA: Diagnosis not present

## 2022-09-04 DIAGNOSIS — R4189 Other symptoms and signs involving cognitive functions and awareness: Secondary | ICD-10-CM

## 2022-09-04 DIAGNOSIS — R3129 Other microscopic hematuria: Secondary | ICD-10-CM

## 2022-09-04 DIAGNOSIS — Z0000 Encounter for general adult medical examination without abnormal findings: Secondary | ICD-10-CM

## 2022-09-04 DIAGNOSIS — Z0001 Encounter for general adult medical examination with abnormal findings: Secondary | ICD-10-CM | POA: Diagnosis not present

## 2022-09-04 DIAGNOSIS — M8580 Other specified disorders of bone density and structure, unspecified site: Secondary | ICD-10-CM | POA: Diagnosis not present

## 2022-09-04 DIAGNOSIS — Z780 Asymptomatic menopausal state: Secondary | ICD-10-CM | POA: Diagnosis not present

## 2022-09-04 DIAGNOSIS — E7800 Pure hypercholesterolemia, unspecified: Secondary | ICD-10-CM

## 2022-09-04 NOTE — Progress Notes (Signed)
Sheila Patrick is a 62 y.o. female presents to office today for annual physical exam examination.    Concerns today include: 1.  Left shoulder pain Patient with ongoing left-sided shoulder pain.  She notes that it is getting slightly better with the chiropractic treatments.  She has about 6 of these left.  She is still not quite ready to go to to an orthopedist.  She does report brain fog.  She wonders if her feelings may be contributing.  She has concerns about potential mercury and would like to get a blood test if there is one that can be obtained for this.  She is not totally sure that she has ongoing mold problems in her home but also wants to know if perhaps that could contribute to some of her brain fog.  She remains very physically active with a balanced diet and minimizes any type of medication use  She continues to have intermittent right inguinal pain.  No observed gross hematuria but she has had microscopic hematuria previously on urinalysis.  She is ready to further investigate this with a specialist  Last colonoscopy: UTD Last mammogram: needs Last pap smear: UTD Refills needed today: none Immunizations needed: Immunization History  Administered Date(s) Administered   Influenza-Unspecified 12/17/2017     Past Medical History:  Diagnosis Date   ARTHRITIS, CERVICAL SPINE 04/11/2009   Qualifier: Diagnosis of  By: Sheila Apple MD, [place]     Back pain    Neck pain    PONV (postoperative nausea and vomiting)    Shoulder pain    Vaginal pain 04/10/2017   Social History   Socioeconomic History   Marital status: Single    Spouse name: Not on file   Number of children: 0   Years of education: Not on file   Highest education level: Not on file  Occupational History   Occupation: administrative assitant  Tobacco Use   Smoking status: Never   Smokeless tobacco: Never  Vaping Use   Vaping Use: Never used  Substance and Sexual Activity   Alcohol use: Not Currently     Comment: occas   Drug use: No   Sexual activity: Not Currently    Comment: single  Other Topics Concern   Not on file  Social History Narrative   Not on file   Social Determinants of Health   Financial Resource Strain: Not on file  Food Insecurity: Not on file  Transportation Needs: Not on file  Physical Activity: Not on file  Stress: Not on file  Social Connections: Not on file  Intimate Partner Violence: Not on file   Past Surgical History:  Procedure Laterality Date   ABDOMINAL HYSTERECTOMY     Total abdominal   COLONOSCOPY N/A 01/21/2019   Procedure: COLONOSCOPY;  Surgeon: Malissa Hippo, MD;  Location: [facility];  Service: Endoscopy;  Laterality: N/A;  930   NASAL SINUS SURGERY  1994   right breast lumpectomy  2002   VESICOVAGINAL FISTULA CLOSURE W/ TAH  1998   Family History  Problem Relation Age of Onset   Asthma Mother    Cancer Father        lung   Lung cancer Father    Asthma Sister    Hypertension Sister    Alcohol abuse Sister    Thyroid cancer Sister    COPD Sister    Heart disease Sister    Hyperlipidemia Sister    Hypertension Sister    Diabetes Sister  Heart attack Sister 56   Cancer Brother        ?laryngeal   Cirrhosis Brother    Alcohol abuse Brother     Current Outpatient Medications:    b complex vitamins tablet, Take 1 tablet by mouth every other day., Disp: , Rfl:    BLACK CURRANT SEED OIL PO, Take by mouth., Disp: , Rfl:    cetirizine (ZYRTEC) 10 MG chewable tablet, Chew 10 mg by mouth as needed for allergies., Disp: , Rfl:    cholecalciferol (VITAMIN D) 1000 units tablet, Take 1,000 Units by mouth every other day. With K2, Disp: , Rfl:    Cod Liver Oil 1000 MG CAPS, Take by mouth., Disp: , Rfl:    cyclobenzaprine (FLEXERIL) 10 MG tablet, Take 10 mg by mouth 3 (three) times daily., Disp: , Rfl:    ketorolac (TORADOL) 10 MG tablet, Take 10 mg by mouth 3 (three) times daily., Disp: , Rfl:    meclizine (ANTIVERT) 25 MG tablet,  Take 1 tablet (25 mg total) by mouth 3 (three) times daily as needed for dizziness., Disp: 30 tablet, Rfl: 0   Omega-3 Fatty Acids (FISH OIL) 1000 MG CAPS, Take by mouth., Disp: , Rfl:   Allergies  Allergen Reactions   Other Other (See Comments)    Dye (unknown)     ROS: Review of Systems Pertinent items noted in HPI and remainder of comprehensive ROS otherwise negative.    Physical exam BP 119/71   Pulse 61   Temp 98.3 F (36.8 C)   Ht 5\' 3"  (1.6 m)   Wt 106 lb (48.1 kg)   SpO2 99%   BMI 18.78 kg/m  General appearance: alert, cooperative, appears stated age, and no distress Head: Normocephalic, without obvious abnormality, atraumatic Eyes: negative findings: lids and lashes normal, conjunctivae and sclerae normal, corneas clear, and pupils equal, round, reactive to light and accomodation Ears: normal TM's and external ear canals both ears Nose: Nares normal. Septum midline. Mucosa normal. No drainage or sinus tenderness. Throat: lips, mucosa, and tongue normal; teeth and gums normal Neck: no adenopathy, no carotid bruit, supple, symmetrical, trachea midline, and thyroid not enlarged, symmetric, no tenderness/mass/nodules Back: symmetric, no curvature. ROM normal. No CVA tenderness. Lungs: clear to auscultation bilaterally Breasts:  She has glandular tissue palpable within the breast.  Inversion of left nipple.  No discharge from the nipple Heart: Regular rate and rhythm.  S1, S2 heard.  No murmurs Abdomen: soft, non-tender; bowel sounds normal; no masses,  no organomegaly Pelvic: cervix normal in appearance, external genitalia normal, no adnexal masses or tenderness, rectovaginal septum normal, vagina normal without discharge, and cervix surgically absent Extremities: extremities normal, atraumatic, no cyanosis or edema Pulses: 2+ and symmetric Skin:  2 seborrheic keratoses versus skin tags noted on either side of the vagina.  The right is pigmented and the left is  flesh-colored Lymph nodes: Cervical, supraclavicular, and axillary nodes normal. Neurologic: Grossly normal Psych: Mood stable, speech normal, affect appropriate.  Very pleasant, interactive   Assessment/ Plan: [name] here for annual physical exam.   Annual physical exam  Pure hypercholesterolemia - Plan: CMP14+EGFR, TSH, Lipid Panel  Osteopenia after menopause - Plan: CBC  High serum vitamin D - Plan: VITAMIN D 25 Hydroxy (Vit-D Deficiency, Fractures)  Other microscopic hematuria - Plan: Ambulatory referral to Urology  Brain fog - Plan: Heavy metals, blood, CANCELED: Heavy Metals, Blood, CANCELED: Allergen Profile, Mold  Fasting labs collected today.  She declines vaccination and mammogram.  I did perform pelvic exam for her per her request as her GYN is out on leave currently.  I placed referral to urology for microscopic hematuria noted.  Not sure if this is generalized and random right inguinal pain has any relation to the microscopic hematuria.  I was able to find a heavy metals test for mercury and I have preordered this for patient.  Unfortunately not sure that we can find a way to test for mold unless she simply wants to be tested and allergen panel.  We did discuss that perhaps integrative care may be more helpful with that as they tend to utilize other labs   Counseled on healthy lifestyle choices, including diet (rich in fruits, vegetables and lean meats and low in salt and simple carbohydrates) and exercise (at least 30 minutes of moderate physical activity daily).  Patient to follow up in 1 year for annual exam or sooner if needed.  Levora Werden M. Nadine Counts, DO

## 2022-09-05 LAB — CBC
Hematocrit: 37.1 % (ref 34.0–46.6)
Hemoglobin: 12 g/dL (ref 11.1–15.9)
MCH: 29.9 pg (ref 26.6–33.0)
MCHC: 32.3 g/dL (ref 31.5–35.7)
MCV: 92 fL (ref 79–97)
Platelets: 251 10*3/uL (ref 150–450)
RBC: 4.02 x10E6/uL (ref 3.77–5.28)
RDW: 13.4 % (ref 11.7–15.4)
WBC: 3.6 10*3/uL (ref 3.4–10.8)

## 2022-09-05 LAB — CMP14+EGFR
ALT: 13 IU/L (ref 0–32)
AST: 13 IU/L (ref 0–40)
Albumin: 4.4 g/dL (ref 3.9–4.9)
Alkaline Phosphatase: 62 IU/L (ref 44–121)
BUN/Creatinine Ratio: 10 — ABNORMAL LOW (ref 12–28)
BUN: 7 mg/dL — ABNORMAL LOW (ref 8–27)
Bilirubin Total: 0.5 mg/dL (ref 0.0–1.2)
CO2: 26 mmol/L (ref 20–29)
Calcium: 9.5 mg/dL (ref 8.7–10.3)
Chloride: 100 mmol/L (ref 96–106)
Creatinine, Ser: 0.69 mg/dL (ref 0.57–1.00)
Globulin, Total: 2.1 g/dL (ref 1.5–4.5)
Glucose: 92 mg/dL (ref 70–99)
Potassium: 3.9 mmol/L (ref 3.5–5.2)
Sodium: 137 mmol/L (ref 134–144)
Total Protein: 6.5 g/dL (ref 6.0–8.5)
eGFR: 97 mL/min/{1.73_m2} (ref >59)

## 2022-09-05 LAB — LIPID PANEL
Chol/HDL Ratio: 2.9 ratio (ref 0.0–4.4)
Cholesterol, Total: 242 mg/dL — ABNORMAL HIGH (ref 100–199)
HDL: 83 mg/dL (ref >39)
LDL Chol Calc (NIH): 150 mg/dL — ABNORMAL HIGH (ref 0–99)
Triglycerides: 56 mg/dL (ref 0–149)
VLDL Cholesterol Cal: 9 mg/dL (ref 5–40)

## 2022-09-05 LAB — VITAMIN D 25 HYDROXY (VIT D DEFICIENCY, FRACTURES): Vit D, 25-Hydroxy: 85.8 ng/mL (ref 30.0–100.0)

## 2022-09-05 LAB — TSH: TSH: 1.16 u[IU]/mL (ref 0.450–4.500)

## 2022-09-12 ENCOUNTER — Other Ambulatory Visit: Payer: 59

## 2022-09-12 DIAGNOSIS — S43422A Sprain of left rotator cuff capsule, initial encounter: Secondary | ICD-10-CM | POA: Diagnosis not present

## 2022-09-12 DIAGNOSIS — S134XXA Sprain of ligaments of cervical spine, initial encounter: Secondary | ICD-10-CM | POA: Diagnosis not present

## 2022-09-17 ENCOUNTER — Encounter: Payer: 59 | Admitting: Family Medicine

## 2022-09-18 DIAGNOSIS — S134XXA Sprain of ligaments of cervical spine, initial encounter: Secondary | ICD-10-CM | POA: Diagnosis not present

## 2022-09-18 DIAGNOSIS — S43422A Sprain of left rotator cuff capsule, initial encounter: Secondary | ICD-10-CM | POA: Diagnosis not present

## 2022-09-19 ENCOUNTER — Other Ambulatory Visit: Payer: Self-pay

## 2022-09-19 ENCOUNTER — Other Ambulatory Visit: Payer: 59

## 2022-09-19 DIAGNOSIS — R4189 Other symptoms and signs involving cognitive functions and awareness: Secondary | ICD-10-CM | POA: Diagnosis not present

## 2022-09-21 LAB — HEAVY METALS, BLOOD
Arsenic: 2 ug/L (ref 0–9)
Lead, Blood: <1 ug/dL (ref 0.0–3.4)
Mercury: 3 ug/L (ref 0.0–14.9)

## 2022-09-24 DIAGNOSIS — S134XXA Sprain of ligaments of cervical spine, initial encounter: Secondary | ICD-10-CM | POA: Diagnosis not present

## 2022-09-24 DIAGNOSIS — S43422A Sprain of left rotator cuff capsule, initial encounter: Secondary | ICD-10-CM | POA: Diagnosis not present

## 2022-09-26 DIAGNOSIS — H11153 Pinguecula, bilateral: Secondary | ICD-10-CM | POA: Diagnosis not present

## 2022-10-03 DIAGNOSIS — S134XXA Sprain of ligaments of cervical spine, initial encounter: Secondary | ICD-10-CM | POA: Diagnosis not present

## 2022-10-03 DIAGNOSIS — S43422A Sprain of left rotator cuff capsule, initial encounter: Secondary | ICD-10-CM | POA: Diagnosis not present

## 2022-10-24 DIAGNOSIS — S43422A Sprain of left rotator cuff capsule, initial encounter: Secondary | ICD-10-CM | POA: Diagnosis not present

## 2022-10-24 DIAGNOSIS — S134XXA Sprain of ligaments of cervical spine, initial encounter: Secondary | ICD-10-CM | POA: Diagnosis not present

## 2022-10-31 DIAGNOSIS — S43422A Sprain of left rotator cuff capsule, initial encounter: Secondary | ICD-10-CM | POA: Diagnosis not present

## 2022-10-31 DIAGNOSIS — S134XXA Sprain of ligaments of cervical spine, initial encounter: Secondary | ICD-10-CM | POA: Diagnosis not present

## 2022-11-26 DIAGNOSIS — H903 Sensorineural hearing loss, bilateral: Secondary | ICD-10-CM | POA: Diagnosis not present

## 2022-11-26 DIAGNOSIS — H6122 Impacted cerumen, left ear: Secondary | ICD-10-CM | POA: Diagnosis not present

## 2022-11-26 DIAGNOSIS — H6982 Other specified disorders of Eustachian tube, left ear: Secondary | ICD-10-CM | POA: Diagnosis not present

## 2023-01-15 ENCOUNTER — Encounter: Payer: Self-pay | Admitting: Family Medicine

## 2023-01-15 ENCOUNTER — Ambulatory Visit (INDEPENDENT_AMBULATORY_CARE_PROVIDER_SITE_OTHER): Payer: 59 | Admitting: Family Medicine

## 2023-01-15 VITALS — BP 116/67 | HR 73 | Temp 98.5°F | Ht 63.0 in | Wt 107.0 lb

## 2023-01-15 DIAGNOSIS — E7800 Pure hypercholesterolemia, unspecified: Secondary | ICD-10-CM

## 2023-01-15 DIAGNOSIS — Z833 Family history of diabetes mellitus: Secondary | ICD-10-CM | POA: Diagnosis not present

## 2023-01-15 DIAGNOSIS — N898 Other specified noninflammatory disorders of vagina: Secondary | ICD-10-CM

## 2023-01-15 DIAGNOSIS — Z87440 Personal history of urinary (tract) infections: Secondary | ICD-10-CM | POA: Diagnosis not present

## 2023-01-15 LAB — URINALYSIS, ROUTINE W REFLEX MICROSCOPIC
Bilirubin, UA: NEGATIVE
Glucose, UA: NEGATIVE
Ketones, UA: NEGATIVE
Nitrite, UA: NEGATIVE
Protein,UA: NEGATIVE
RBC, UA: NEGATIVE
Specific Gravity, UA: 1.01 (ref 1.005–1.030)
Urobilinogen, Ur: 0.2 mg/dL (ref 0.2–1.0)
pH, UA: 7.5 (ref 5.0–7.5)

## 2023-01-15 LAB — WET PREP FOR TRICH, YEAST, CLUE
Clue Cell Exam: NEGATIVE
Trichomonas Exam: NEGATIVE
Yeast Exam: NEGATIVE

## 2023-01-15 LAB — MICROSCOPIC EXAMINATION
RBC, Urine: NONE SEEN /[HPF] (ref 0–2)
Renal Epithel, UA: NONE SEEN /[HPF]
Yeast, UA: NONE SEEN

## 2023-01-15 NOTE — Patient Instructions (Signed)
Atrophic Vaginitis  Atrophic vaginitis is when the lining of the vagina becomes dry and thin. This is most common in women who have stopped having their periods (are in menopause). It usually starts when a woman is 31 to 64 years old. What are the causes? This condition is caused by a drop in a female hormone (estrogen). What increases the risk? You are more likely to develop this condition if: You take certain medicines. You have had your ovaries taken out. You are being treated for cancer. You have given birth or are breastfeeding. You are more than 64 years old. You smoke. What are the signs or symptoms? Pain during sex. A feeling of pressure during sex. Bleeding during sex. Burning or itching in the vagina. Burning pain when you pee (urinate). Fluid coming from your vagina. Some people do not have symptoms. How is this treated? Using a lubricant before sex. Using a moisturizer in the vagina. Using estrogen in the vagina. In some cases, you may not need treatment. Follow these instructions at home: Medicines Take all medicines only as told by your doctor. This includes medicines for dryness. Do not use herbal medicines unless your doctor says it is okay. General instructions Talk with your doctor about treatment. Do not douche. Do not use scented: Sprays. Tampons. Soaps. If sex hurts, try using lubricants right before you have sex. Contact a doctor if: You have fluid coming from the vagina that is not like normal. You have a bad smell coming from your vagina. You have new symptoms. Your symptoms do not get better when treated. Your symptoms get worse. Summary This condition happens when the lining of the vagina becomes dry and thin. It is most common in women who no longer have periods. Treatment may include using medicines for dryness. Call a doctor if your symptoms do not get better. This information is not intended to replace advice given to you by your health  care provider. Make sure you discuss any questions you have with your health care provider. Document Revised: 09/03/2019 Document Reviewed: 09/03/2019 Elsevier Patient Education  2024 [employer].

## 2023-01-15 NOTE — Progress Notes (Signed)
Subjective: CC: Vaginitis PCP: Raliegh Ip, DO [id number] R Lucking is a 62 y.o. female presenting to clinic today for:  1.  Vaginal discharge Patient reports that she is been having a thick yellowish-green discharge ever since she started on estrogen vaginally.  She is not sure what the cream looks like as it is in on the oral but does note that this is abnormal for her.  She denies any vaginal itching or odors.  She is not sexually active.  No vaginal bleeding.  She also reports recent UTI and would like to make sure that this is cleared.  She denies any dysuria, hematuria, fevers or flank pain.  Under the care of urology with next OV after new year  2.  Weight Her family voices concern about her weight.  She notes that she eats a pretty balanced diet.  She has had all of her cancer screening done.  She does not want a be on any medications to promote appetite.   ROS: Per HPI  Allergies  Allergen Reactions   Other Other (See Comments)    Dye (unknown)   Past Medical History:  Diagnosis Date   ARTHRITIS, CERVICAL SPINE 04/11/2009   Qualifier: Diagnosis of  By: Romeo Apple MD, [place]     Back pain    Neck pain    PONV (postoperative nausea and vomiting)    Shoulder pain    Vaginal pain 04/10/2017    Current Outpatient Medications:    b complex vitamins tablet, Take 1 tablet by mouth every other day., Disp: , Rfl:    BLACK CURRANT SEED OIL PO, Take by mouth., Disp: , Rfl:    cholecalciferol (VITAMIN D) 1000 units tablet, Take 1,000 Units by mouth every other day. With K2, Disp: , Rfl:    Cod Liver Oil 1000 MG CAPS, Take by mouth., Disp: , Rfl:    Omega-3 Fatty Acids (FISH OIL) 1000 MG CAPS, Take by mouth., Disp: , Rfl:    YUVAFEM 10 MCG TABS vaginal tablet, Place 1 tablet vaginally 2 (two) times a week., Disp: , Rfl:  Social History   Socioeconomic History   Marital status: Single    Spouse name: Not on file   Number of children: 0   Years of education: Not on  file   Highest education level: Associate degree: occupational, Scientist, product/process development, or vocational program  Occupational History   Occupation: Materials engineer  Tobacco Use   Smoking status: Never   Smokeless tobacco: Never  Vaping Use   Vaping status: Never Used  Substance and Sexual Activity   Alcohol use: Not Currently    Comment: occas   Drug use: No   Sexual activity: Not Currently    Comment: single  Other Topics Concern   Not on file  Social History Narrative   Not on file   Social Determinants of Health   Financial Resource Strain: Low Risk  (01/14/2023)   Overall Financial Resource Strain (CARDIA)    Difficulty of Paying Living Expenses: Not hard at all  Food Insecurity: No Food Insecurity (01/14/2023)   Hunger Vital Sign    Worried About Running Out of Food in the Last Year: Never true    Ran Out of Food in the Last Year: Never true  Transportation Needs: No Transportation Needs (01/14/2023)   PRAPARE - Administrator, Civil Service (Medical): No    Lack of Transportation (Non-Medical): No  Physical Activity: Insufficiently Active (01/14/2023)   Exercise Vital Sign  Days of Exercise per Week: 3 days    Minutes of Exercise per Session: 30 min  Stress: No Stress Concern Present (01/14/2023)   [employer] of Occupational Health - Occupational Stress Questionnaire    Feeling of Stress : Not at all  Social Connections: Unknown (01/14/2023)   Social Connection and Isolation Panel [NHANES]    Frequency of Communication with Friends and Family: More than three times a week    Frequency of Social Gatherings with Friends and Family: More than three times a week    Attends Religious Services: More than 4 times per year    Active Member of [employer] or Organizations: Yes    Attends Engineer, structural: More than 4 times per year    Marital Status: Patient declined  Catering manager Violence: Not on file   Family History  Problem Relation Age of  Onset   Asthma Mother    Cancer Father        lung   Lung cancer Father    Asthma Sister    Hypertension Sister    Alcohol abuse Sister    Thyroid cancer Sister    COPD Sister    Heart disease Sister    Hyperlipidemia Sister    Hypertension Sister    Diabetes Sister    Heart attack Sister 71   Cancer Brother        ?laryngeal   Cirrhosis Brother    Alcohol abuse Brother     Objective: Office vital signs reviewed. BP 116/67   Pulse 73   Temp 98.5 F (36.9 C)   Ht 5\' 3"  (1.6 m)   Wt 107 lb (48.5 kg)   SpO2 96%   BMI 18.95 kg/m   Physical Examination:  General: Awake, alert, thin female, No acute distress HEENT: sclera white, MMM Cardio: regular rate and rhythm  Pulm:  normal work of breathing on room air GU: external vaginal tissue atrophic, cervix midline and high, no punctate lesions on cervix appreciated, scant white discharge from cervical os, no bleeding, no cervical motion tenderness     Assessment/ Plan: 62 y.o. female   Vaginal discharge - Plan: Wet prep, genital  Recent urinary tract infection - Plan: Urine Culture, Urinalysis, Routine w reflex microscopic  Pure hypercholesterolemia - Plan: CT CARDIAC SCORING (SELF PAY ONLY)  Family history of diabetes mellitus (DM) - Plan: CT CARDIAC SCORING (SELF PAY ONLY)  Wet prep without evidence of bacterial vaginosis.  She does have some bacteria but I think this is normal vaginal flora, as no clue cells or yeast were seen under microscopy.  I do suspect that the discharge she is seeing is likely related to the use of vaginal estrogen ovule  Urinalysis without evidence of ongoing infection but will send for culture  I have reordered her CT coronary artery calcium scoring.  She knows that they will contact her to set up appointment.  She will set up her annual physical with fasting labs after the new year   Velisa Regnier M Kendel Pesnell, DO [facility] [phone]

## 2023-01-17 LAB — URINE CULTURE

## 2023-01-18 ENCOUNTER — Telehealth: Payer: Self-pay

## 2023-01-18 NOTE — Telephone Encounter (Signed)
Patient wants to let you know she is going to cancel the CT cardiac scoring test, she is concerned about the amount of radiation and does not want to have it done.

## 2023-01-29 ENCOUNTER — Other Ambulatory Visit (HOSPITAL_COMMUNITY): Payer: Self-pay

## 2023-02-12 ENCOUNTER — Encounter: Payer: Self-pay | Admitting: Family Medicine

## 2023-03-15 ENCOUNTER — Telehealth: Payer: Self-pay

## 2023-03-15 NOTE — Progress Notes (Signed)
Successful call to patient on today regarding preventative mammogram screening. Patient declined at this time as she prefers to have ultrasound done. Patient will follow up with PCP at later date.  Baruch Gouty [place]/VBCI  [facility] Assistant-Population Health [phone]

## 2023-04-16 ENCOUNTER — Encounter: Payer: Self-pay | Admitting: Family Medicine

## 2023-04-22 DIAGNOSIS — R102 Pelvic and perineal pain: Secondary | ICD-10-CM | POA: Diagnosis not present

## 2023-04-22 DIAGNOSIS — K5900 Constipation, unspecified: Secondary | ICD-10-CM | POA: Diagnosis not present

## 2023-04-22 DIAGNOSIS — M6289 Other specified disorders of muscle: Secondary | ICD-10-CM | POA: Diagnosis not present

## 2023-04-22 DIAGNOSIS — N9419 Other specified dyspareunia: Secondary | ICD-10-CM | POA: Diagnosis not present

## 2023-05-15 DIAGNOSIS — M6281 Muscle weakness (generalized): Secondary | ICD-10-CM | POA: Diagnosis not present

## 2023-05-15 DIAGNOSIS — N952 Postmenopausal atrophic vaginitis: Secondary | ICD-10-CM | POA: Diagnosis not present

## 2023-05-15 DIAGNOSIS — K5900 Constipation, unspecified: Secondary | ICD-10-CM | POA: Diagnosis not present

## 2023-05-15 DIAGNOSIS — R8271 Bacteriuria: Secondary | ICD-10-CM | POA: Diagnosis not present

## 2023-05-15 DIAGNOSIS — M6289 Other specified disorders of muscle: Secondary | ICD-10-CM | POA: Diagnosis not present

## 2023-05-15 DIAGNOSIS — R102 Pelvic and perineal pain: Secondary | ICD-10-CM | POA: Diagnosis not present

## 2023-05-15 DIAGNOSIS — N9419 Other specified dyspareunia: Secondary | ICD-10-CM | POA: Diagnosis not present

## 2023-06-05 DIAGNOSIS — M6281 Muscle weakness (generalized): Secondary | ICD-10-CM | POA: Diagnosis not present

## 2023-06-05 DIAGNOSIS — M6289 Other specified disorders of muscle: Secondary | ICD-10-CM | POA: Diagnosis not present

## 2023-06-05 DIAGNOSIS — R102 Pelvic and perineal pain: Secondary | ICD-10-CM | POA: Diagnosis not present

## 2023-06-05 DIAGNOSIS — K5900 Constipation, unspecified: Secondary | ICD-10-CM | POA: Diagnosis not present

## 2023-06-18 DIAGNOSIS — N390 Urinary tract infection, site not specified: Secondary | ICD-10-CM | POA: Diagnosis not present

## 2023-06-18 DIAGNOSIS — N952 Postmenopausal atrophic vaginitis: Secondary | ICD-10-CM | POA: Diagnosis not present

## 2023-06-18 DIAGNOSIS — N3000 Acute cystitis without hematuria: Secondary | ICD-10-CM | POA: Diagnosis not present

## 2023-06-18 DIAGNOSIS — B9620 Unspecified Escherichia coli [E. coli] as the cause of diseases classified elsewhere: Secondary | ICD-10-CM | POA: Diagnosis not present

## 2023-06-26 DIAGNOSIS — M6281 Muscle weakness (generalized): Secondary | ICD-10-CM | POA: Diagnosis not present

## 2023-06-26 DIAGNOSIS — N9419 Other specified dyspareunia: Secondary | ICD-10-CM | POA: Diagnosis not present

## 2023-06-26 DIAGNOSIS — M6289 Other specified disorders of muscle: Secondary | ICD-10-CM | POA: Diagnosis not present

## 2023-06-26 DIAGNOSIS — K5900 Constipation, unspecified: Secondary | ICD-10-CM | POA: Diagnosis not present

## 2023-06-26 DIAGNOSIS — R102 Pelvic and perineal pain: Secondary | ICD-10-CM | POA: Diagnosis not present

## 2023-07-17 DIAGNOSIS — H269 Unspecified cataract: Secondary | ICD-10-CM | POA: Diagnosis not present

## 2023-07-17 DIAGNOSIS — E785 Hyperlipidemia, unspecified: Secondary | ICD-10-CM | POA: Diagnosis not present

## 2023-07-17 DIAGNOSIS — Z8249 Family history of ischemic heart disease and other diseases of the circulatory system: Secondary | ICD-10-CM | POA: Diagnosis not present

## 2023-07-17 DIAGNOSIS — N952 Postmenopausal atrophic vaginitis: Secondary | ICD-10-CM | POA: Diagnosis not present

## 2023-07-17 DIAGNOSIS — K5900 Constipation, unspecified: Secondary | ICD-10-CM | POA: Diagnosis not present

## 2023-07-17 DIAGNOSIS — R030 Elevated blood-pressure reading, without diagnosis of hypertension: Secondary | ICD-10-CM | POA: Diagnosis not present

## 2023-07-17 DIAGNOSIS — Z87440 Personal history of urinary (tract) infections: Secondary | ICD-10-CM | POA: Diagnosis not present

## 2023-07-17 DIAGNOSIS — M8580 Other specified disorders of bone density and structure, unspecified site: Secondary | ICD-10-CM | POA: Diagnosis not present

## 2023-07-17 DIAGNOSIS — Z809 Family history of malignant neoplasm, unspecified: Secondary | ICD-10-CM | POA: Diagnosis not present

## 2023-07-18 DIAGNOSIS — K5900 Constipation, unspecified: Secondary | ICD-10-CM | POA: Diagnosis not present

## 2023-07-18 DIAGNOSIS — R102 Pelvic and perineal pain: Secondary | ICD-10-CM | POA: Diagnosis not present

## 2023-07-18 DIAGNOSIS — M6281 Muscle weakness (generalized): Secondary | ICD-10-CM | POA: Diagnosis not present

## 2023-07-18 DIAGNOSIS — M6289 Other specified disorders of muscle: Secondary | ICD-10-CM | POA: Diagnosis not present

## 2023-07-18 DIAGNOSIS — N9419 Other specified dyspareunia: Secondary | ICD-10-CM | POA: Diagnosis not present

## 2023-08-14 DIAGNOSIS — K5900 Constipation, unspecified: Secondary | ICD-10-CM | POA: Diagnosis not present

## 2023-08-14 DIAGNOSIS — R102 Pelvic and perineal pain: Secondary | ICD-10-CM | POA: Diagnosis not present

## 2023-08-14 DIAGNOSIS — M62838 Other muscle spasm: Secondary | ICD-10-CM | POA: Diagnosis not present

## 2023-08-14 DIAGNOSIS — M6281 Muscle weakness (generalized): Secondary | ICD-10-CM | POA: Diagnosis not present

## 2023-08-14 DIAGNOSIS — L910 Hypertrophic scar: Secondary | ICD-10-CM | POA: Diagnosis not present

## 2023-08-14 DIAGNOSIS — M6289 Other specified disorders of muscle: Secondary | ICD-10-CM | POA: Diagnosis not present

## 2023-09-06 ENCOUNTER — Encounter: Payer: Self-pay | Admitting: Family Medicine

## 2023-09-06 ENCOUNTER — Ambulatory Visit (INDEPENDENT_AMBULATORY_CARE_PROVIDER_SITE_OTHER): Payer: 59 | Admitting: Family Medicine

## 2023-09-06 VITALS — BP 127/68 | HR 67 | Temp 97.1°F | Ht 63.0 in | Wt 109.6 lb

## 2023-09-06 DIAGNOSIS — Z0000 Encounter for general adult medical examination without abnormal findings: Secondary | ICD-10-CM

## 2023-09-06 DIAGNOSIS — Z0001 Encounter for general adult medical examination with abnormal findings: Secondary | ICD-10-CM | POA: Diagnosis not present

## 2023-09-06 DIAGNOSIS — R7989 Other specified abnormal findings of blood chemistry: Secondary | ICD-10-CM | POA: Diagnosis not present

## 2023-09-06 DIAGNOSIS — Z79890 Hormone replacement therapy (postmenopausal): Secondary | ICD-10-CM | POA: Diagnosis not present

## 2023-09-06 DIAGNOSIS — Z5320 Procedure and treatment not carried out because of patient's decision for unspecified reasons: Secondary | ICD-10-CM | POA: Diagnosis not present

## 2023-09-06 DIAGNOSIS — Z90710 Acquired absence of both cervix and uterus: Secondary | ICD-10-CM | POA: Diagnosis not present

## 2023-09-06 DIAGNOSIS — M8580 Other specified disorders of bone density and structure, unspecified site: Secondary | ICD-10-CM | POA: Diagnosis not present

## 2023-09-06 DIAGNOSIS — L819 Disorder of pigmentation, unspecified: Secondary | ICD-10-CM | POA: Diagnosis not present

## 2023-09-06 DIAGNOSIS — Z780 Asymptomatic menopausal state: Secondary | ICD-10-CM

## 2023-09-06 DIAGNOSIS — N390 Urinary tract infection, site not specified: Secondary | ICD-10-CM | POA: Diagnosis not present

## 2023-09-06 DIAGNOSIS — E7800 Pure hypercholesterolemia, unspecified: Secondary | ICD-10-CM

## 2023-09-06 LAB — URINALYSIS, ROUTINE W REFLEX MICROSCOPIC
Bilirubin, UA: NEGATIVE
Glucose, UA: NEGATIVE
Ketones, UA: NEGATIVE
Leukocytes,UA: NEGATIVE
Nitrite, UA: NEGATIVE
Protein,UA: NEGATIVE
RBC, UA: NEGATIVE
Specific Gravity, UA: 1.01 (ref 1.005–1.030)
Urobilinogen, Ur: 0.2 mg/dL (ref 0.2–1.0)
pH, UA: 7 (ref 5.0–7.5)

## 2023-09-06 LAB — LIPID PANEL

## 2023-09-06 NOTE — Progress Notes (Signed)
 Sheila Patrick is a 63 y.o. female presents to office today for annual physical exam examination.    Concerns today include: 1. Spot on left shoulder Reports it appeared a few months ago. It is brown. No spontaneous bleeding.  Not seeing a derm but wants to.    2. HRT Now on HRT vaginally with urology since fall. Doing much better on this.  Continues to have intermittent right lower quadrant abdominal pain and this is thought maybe to be from some scarring given her history of hysterectomy.  She is continuing with pelvic floor rehabilitation and that has been really helpful  There are no preventive care reminders to display for this patient.  Refills needed today: none  Immunization History  Administered Date(s) Administered   Influenza-Unspecified 12/17/2017   Past Medical History:  Diagnosis Date   ARTHRITIS, CERVICAL SPINE 04/11/2009   Qualifier: Diagnosis of  By: Phyllis Breeze MD, Stanley     Back pain    Neck pain    PONV (postoperative nausea and vomiting)    Shoulder pain    Vaginal pain 04/10/2017   Social History   Socioeconomic History   Marital status: Single    Spouse name: Not on file   Number of children: 0   Years of education: Not on file   Highest education level: Associate degree: occupational, Scientist, product/process development, or vocational program  Occupational History   Occupation: Materials engineer  Tobacco Use   Smoking status: Never   Smokeless tobacco: Never  Vaping Use   Vaping status: Never Used  Substance and Sexual Activity   Alcohol use: Not Currently    Comment: occas   Drug use: No   Sexual activity: Not Currently    Comment: single  Other Topics Concern   Not on file  Social History Narrative   Not on file   Social Drivers of Health   Financial Resource Strain: Low Risk  (01/14/2023)   Overall Financial Resource Strain (CARDIA)    Difficulty of Paying Living Expenses: Not hard at all  Food Insecurity: No Food Insecurity (01/14/2023)   Hunger  Vital Sign    Worried About Running Out of Food in the Last Year: Never true    Ran Out of Food in the Last Year: Never true  Transportation Needs: No Transportation Needs (01/14/2023)   PRAPARE - Administrator, Civil Service (Medical): No    Lack of Transportation (Non-Medical): No  Physical Activity: Insufficiently Active (01/14/2023)   Exercise Vital Sign    Days of Exercise per Week: 3 days    Minutes of Exercise per Session: 30 min  Stress: No Stress Concern Present (01/14/2023)   [employer] of Occupational Health - Occupational Stress Questionnaire    Feeling of Stress : Not at all  Social Connections: Unknown (01/14/2023)   Social Connection and Isolation Panel    Frequency of Communication with Friends and Family: More than three times a week    Frequency of Social Gatherings with Friends and Family: More than three times a week    Attends Religious Services: More than 4 times per year    Active Member of Clubs or Organizations: Yes    Attends Banker Meetings: More than 4 times per year    Marital Status: Patient declined  Intimate Partner Violence: Not on file   Past Surgical History:  Procedure Laterality Date   ABDOMINAL HYSTERECTOMY     Total abdominal   COLONOSCOPY N/A 01/21/2019   Procedure:  COLONOSCOPY;  Surgeon: Ruby Corporal, MD;  Location: [facility];  Service: Endoscopy;  Laterality: N/A;  930   NASAL SINUS SURGERY  1994   right breast lumpectomy  2002   VESICOVAGINAL FISTULA CLOSURE W/ TAH  1998   Family History  Problem Relation Age of Onset   Asthma Mother    Cancer Father        lung   Lung cancer Father    Asthma Sister    Hypertension Sister    Alcohol abuse Sister    Thyroid  cancer Sister    COPD Sister    Heart disease Sister    Hyperlipidemia Sister    Hypertension Sister    Diabetes Sister    Heart attack Sister 41   Cancer Brother        ?laryngeal   Cirrhosis Brother    Alcohol abuse Brother      Current Outpatient Medications:    b complex vitamins tablet, Take 1 tablet by mouth every other day., Disp: , Rfl:    BLACK CURRANT SEED OIL PO, Take by mouth., Disp: , Rfl:    Omega-3 Fatty Acids (FISH OIL) 1000 MG CAPS, Take by mouth., Disp: , Rfl:    PREMARIN vaginal cream, Place 1 applicator vaginally., Disp: , Rfl:   Allergies  Allergen Reactions   Other Other (See Comments)    Dye (unknown)     ROS: Review of Systems Pertinent items noted in HPI and remainder of comprehensive ROS otherwise negative.    Physical exam BP 127/68   Pulse 67   Temp (!) 97.1 F (36.2 C)   Ht 5' 3 (1.6 m)   Wt 109 lb 9.6 oz (49.7 kg)   SpO2 100%   BMI 19.41 kg/m  General appearance: alert, cooperative, appears stated age, and no distress Head: Normocephalic, without obvious abnormality, atraumatic Eyes: negative findings: lids and lashes normal, conjunctivae and sclerae normal, corneas clear, and pupils equal, round, reactive to light and accomodation Ears: normal TM's and external ear canals both ears Nose: Nares normal. Septum midline. Mucosa normal. No drainage or sinus tenderness. Throat: lips, mucosa, and tongue normal; teeth and gums normal Neck: no adenopathy, no carotid bruit, supple, symmetrical, trachea midline, and thyroid  not enlarged, symmetric, no tenderness/mass/nodules Back: symmetric, no curvature. ROM normal. No CVA tenderness. Lungs: clear to auscultation bilaterally Breasts: normal appearance, no masses or tenderness, Inspection negative, No nipple discharge or bleeding, No axillary or supraclavicular adenopathy, mild nipple retraction but this is her baseline on the left Heart: regular rate and rhythm, S1, S2 normal, no murmur, click, rub or gallop Abdomen: soft, non-tender; bowel sounds normal; no masses,  no organomegaly Pelvic: rectovaginal septum normal, vagina normal without discharge, and cervix surgically absent. Extremities: extremities normal, atraumatic, no  cyanosis or edema Pulses: 2+ and symmetric Skin: Pigmented skin lesion on the left shoulder.  See photo below Lymph nodes: Cervical, supraclavicular, and axillary nodes normal. Neurologic: Grossly normal          09/06/2023    8:56 AM 01/15/2023    1:04 PM 09/04/2022    9:10 AM  Depression screen PHQ 2/9  Decreased Interest 0 0 0  Down, Depressed, Hopeless 0 0 0  PHQ - 2 Score 0 0 0  Altered sleeping 0 0 1  Tired, decreased energy 0 0 0  Change in appetite 0 0 0  Feeling bad or failure about yourself  0 0 0  Trouble concentrating 0 0 0  Moving slowly  or fidgety/restless 0 0 0  Suicidal thoughts 0 0 0  PHQ-9 Score 0 0 1  Difficult doing work/chores Not difficult at all Not difficult at all Not difficult at all      09/06/2023    8:56 AM 01/15/2023    1:04 PM 09/04/2022    9:21 AM 03/20/2022    3:46 PM  GAD 7 : Generalized Anxiety Score  Nervous, Anxious, on Edge 0 0 0 0  Control/stop worrying 0 0 0 0  Worry too much - different things 0 0 0 0  Trouble relaxing 0 0 0 0  Restless 0 0 0 0  Easily annoyed or irritable 0 0 0 0  Afraid - awful might happen 0 0 0 0  Total GAD 7 Score 0 0 0 0  Anxiety Difficulty Not difficult at all Not difficult at all Not difficult at all Not difficult at all     Assessment/ Plan: Kalvin Orf here for annual physical exam.   Annual physical exam  Pigmented skin lesion of uncertain nature - Plan: Ambulatory referral to Dermatology  Pure hypercholesterolemia - Plan: CMP14+EGFR, TSH, Lipid panel  Osteopenia after menopause - Plan: CBC with Differential/Platelet, VITAMIN D  25 Hydroxy (Vit-D Deficiency, Fractures)  High serum vitamin D  - Plan: VITAMIN D  25 Hydroxy (Vit-D Deficiency, Fractures)  Frequent UTI - Plan: Urinalysis, Routine w reflex microscopic  Hormone replacement therapy (HRT)  History of hysterectomy  Breast cancer screening declined  She declines mammogram due to discomfort.  Declines vaccinations today.     Referral to dermatology for pigmented skin lesion given that it is new.  It did have slight pearlescent but no central umbilication and no vascularity appreciated  Fasting labs collected today.  Advised to get DEXA scan set up given history of osteopenia  Urinalysis collected given history of frequent urinary tract infection.  She is asymptomatic currently  Getting HRT vaginally and doing well with that.  Has history of hysterectomy so no progesterone needed.  Counseled on healthy lifestyle choices, including diet (rich in fruits, vegetables and lean meats and low in salt and simple carbohydrates) and exercise (at least 30 minutes of moderate physical activity daily).  Patient to follow up 1 year for CPE  Quindarrius Joplin M. Bonnell Butcher, DO

## 2023-09-06 NOTE — Patient Instructions (Addendum)
 If you decide you want to see Ob, Dr Randolm Butte might be a good fit for you at [facility] in [place]. Referral to Dr Myrtie Atkinson for spots on skin placed.

## 2023-09-07 LAB — CBC WITH DIFFERENTIAL/PLATELET
Basophils Absolute: 0 10*3/uL (ref 0.0–0.2)
Basos: 1 %
EOS (ABSOLUTE): 0.1 10*3/uL (ref 0.0–0.4)
Eos: 1 %
Hematocrit: 39.6 % (ref 34.0–46.6)
Hemoglobin: 12.8 g/dL (ref 11.1–15.9)
Immature Grans (Abs): 0 10*3/uL (ref 0.0–0.1)
Immature Granulocytes: 0 %
Lymphocytes Absolute: 1.9 10*3/uL (ref 0.7–3.1)
Lymphs: 35 %
MCH: 30 pg (ref 26.6–33.0)
MCHC: 32.3 g/dL (ref 31.5–35.7)
MCV: 93 fL (ref 79–97)
Monocytes Absolute: 0.3 10*3/uL (ref 0.1–0.9)
Monocytes: 6 %
Neutrophils Absolute: 3.2 10*3/uL (ref 1.4–7.0)
Neutrophils: 57 %
Platelets: 283 10*3/uL (ref 150–450)
RBC: 4.27 x10E6/uL (ref 3.77–5.28)
RDW: 13.4 % (ref 11.7–15.4)
WBC: 5.5 10*3/uL (ref 3.4–10.8)

## 2023-09-07 LAB — CMP14+EGFR
ALT: 15 IU/L (ref 0–32)
AST: 18 IU/L (ref 0–40)
Albumin: 4.4 g/dL (ref 3.9–4.9)
Alkaline Phosphatase: 61 IU/L (ref 44–121)
BUN/Creatinine Ratio: 14 (ref 12–28)
BUN: 11 mg/dL (ref 8–27)
Bilirubin Total: 0.6 mg/dL (ref 0.0–1.2)
CO2: 24 mmol/L (ref 20–29)
Calcium: 9.3 mg/dL (ref 8.7–10.3)
Chloride: 99 mmol/L (ref 96–106)
Creatinine, Ser: 0.8 mg/dL (ref 0.57–1.00)
Globulin, Total: 2.5 g/dL (ref 1.5–4.5)
Glucose: 94 mg/dL (ref 70–99)
Potassium: 4.1 mmol/L (ref 3.5–5.2)
Sodium: 136 mmol/L (ref 134–144)
Total Protein: 6.9 g/dL (ref 6.0–8.5)
eGFR: 82 mL/min/{1.73_m2} (ref >59)

## 2023-09-07 LAB — LIPID PANEL
Chol/HDL Ratio: 2.6 ratio (ref 0.0–4.4)
Cholesterol, Total: 234 mg/dL — ABNORMAL HIGH (ref 100–199)
HDL: 89 mg/dL (ref >39)
LDL Chol Calc (NIH): 136 mg/dL — ABNORMAL HIGH (ref 0–99)
Triglycerides: 56 mg/dL (ref 0–149)
VLDL Cholesterol Cal: 9 mg/dL (ref 5–40)

## 2023-09-07 LAB — TSH: TSH: 1.05 u[IU]/mL (ref 0.450–4.500)

## 2023-09-07 LAB — VITAMIN D 25 HYDROXY (VIT D DEFICIENCY, FRACTURES): Vit D, 25-Hydroxy: 100 ng/mL (ref 30.0–100.0)

## 2023-09-09 ENCOUNTER — Ambulatory Visit: Payer: Self-pay | Admitting: Family Medicine

## 2023-09-12 ENCOUNTER — Telehealth: Payer: Self-pay

## 2023-09-12 DIAGNOSIS — K5900 Constipation, unspecified: Secondary | ICD-10-CM | POA: Diagnosis not present

## 2023-09-12 DIAGNOSIS — M6281 Muscle weakness (generalized): Secondary | ICD-10-CM | POA: Diagnosis not present

## 2023-09-12 DIAGNOSIS — R102 Pelvic and perineal pain: Secondary | ICD-10-CM | POA: Diagnosis not present

## 2023-09-12 DIAGNOSIS — L910 Hypertrophic scar: Secondary | ICD-10-CM | POA: Diagnosis not present

## 2023-09-12 DIAGNOSIS — N9419 Other specified dyspareunia: Secondary | ICD-10-CM | POA: Diagnosis not present

## 2023-09-12 DIAGNOSIS — M6289 Other specified disorders of muscle: Secondary | ICD-10-CM | POA: Diagnosis not present

## 2023-09-12 DIAGNOSIS — M62838 Other muscle spasm: Secondary | ICD-10-CM | POA: Diagnosis not present

## 2023-09-12 NOTE — Telephone Encounter (Signed)
 Copied from CRM [id number]. Topic: Clinical - Request for Lab/Test Order >> Sep 12, 2023  1:39 PM Myrick T wrote: Reason for CRM: patient needs to schedule an appt for a bone density test. Please f/u with patient

## 2023-09-13 NOTE — Telephone Encounter (Signed)
 LMTCB

## 2023-09-16 NOTE — Telephone Encounter (Signed)
 Appt made

## 2023-09-17 ENCOUNTER — Other Ambulatory Visit: Payer: Self-pay | Admitting: Family Medicine

## 2023-09-17 ENCOUNTER — Other Ambulatory Visit (INDEPENDENT_AMBULATORY_CARE_PROVIDER_SITE_OTHER)

## 2023-09-17 DIAGNOSIS — Z780 Asymptomatic menopausal state: Secondary | ICD-10-CM | POA: Diagnosis not present

## 2023-09-18 ENCOUNTER — Ambulatory Visit: Payer: Self-pay | Admitting: Family Medicine

## 2023-09-18 DIAGNOSIS — Z780 Asymptomatic menopausal state: Secondary | ICD-10-CM | POA: Diagnosis not present

## 2023-09-18 DIAGNOSIS — M8589 Other specified disorders of bone density and structure, multiple sites: Secondary | ICD-10-CM | POA: Diagnosis not present

## 2023-09-18 DIAGNOSIS — M8580 Other specified disorders of bone density and structure, unspecified site: Secondary | ICD-10-CM

## 2023-10-08 DIAGNOSIS — M6281 Muscle weakness (generalized): Secondary | ICD-10-CM | POA: Diagnosis not present

## 2023-10-08 DIAGNOSIS — M6289 Other specified disorders of muscle: Secondary | ICD-10-CM | POA: Diagnosis not present

## 2023-10-08 DIAGNOSIS — N9419 Other specified dyspareunia: Secondary | ICD-10-CM | POA: Diagnosis not present

## 2023-10-08 DIAGNOSIS — L910 Hypertrophic scar: Secondary | ICD-10-CM | POA: Diagnosis not present

## 2023-10-08 DIAGNOSIS — R102 Pelvic and perineal pain: Secondary | ICD-10-CM | POA: Diagnosis not present

## 2023-10-08 DIAGNOSIS — R150 Incomplete defecation: Secondary | ICD-10-CM | POA: Diagnosis not present

## 2023-10-08 DIAGNOSIS — M62838 Other muscle spasm: Secondary | ICD-10-CM | POA: Diagnosis not present

## 2023-10-08 DIAGNOSIS — K5900 Constipation, unspecified: Secondary | ICD-10-CM | POA: Diagnosis not present

## 2023-10-11 DIAGNOSIS — H0100B Unspecified blepharitis left eye, upper and lower eyelids: Secondary | ICD-10-CM | POA: Diagnosis not present

## 2023-10-11 DIAGNOSIS — H0100A Unspecified blepharitis right eye, upper and lower eyelids: Secondary | ICD-10-CM | POA: Diagnosis not present

## 2023-10-11 DIAGNOSIS — H04123 Dry eye syndrome of bilateral lacrimal glands: Secondary | ICD-10-CM | POA: Diagnosis not present

## 2023-10-22 ENCOUNTER — Ambulatory Visit: Payer: Self-pay | Admitting: Dermatology

## 2023-10-22 ENCOUNTER — Encounter: Payer: Self-pay | Admitting: Dermatology

## 2023-10-22 DIAGNOSIS — Z719 Counseling, unspecified: Secondary | ICD-10-CM

## 2023-10-22 DIAGNOSIS — D485 Neoplasm of uncertain behavior of skin: Secondary | ICD-10-CM | POA: Diagnosis not present

## 2023-10-22 DIAGNOSIS — D2362 Other benign neoplasm of skin of left upper limb, including shoulder: Secondary | ICD-10-CM

## 2023-10-22 DIAGNOSIS — B079 Viral wart, unspecified: Secondary | ICD-10-CM

## 2023-10-22 NOTE — Progress Notes (Signed)
   New Patient Visit   Subjective  Sheila Patrick is a 63 y.o. female who presents for the following: lesion of concern on the left shoulder.  Dark spot on the left shoulder has been present for a few months. Asymptomatic but still concerning to patient.  There is a lesion on the left upper lip that comes and goes. She states that it just appeared about 3 months. Not previously treated. Will grow hard piece of skin at times.   The following portions of the chart were reviewed this encounter and updated as appropriate: medications, allergies, medical history  Review of Systems:  No other skin or systemic complaints except as noted in HPI or Assessment and Plan.  Objective  Well appearing patient in no apparent distress; mood and affect are within normal limits.  A focused examination was performed of the following areas: Right shoulder and left forearm  Relevant exam findings are noted in the Assessment and Plan.  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 Treatment Plan: Will proceed with biopsy below.   WART Exam: verrucous papule(s) on left vermilion upper lip w/ previous cutaneous horn  Counseling Discussed viral / HPV (Human Papilloma Virus) etiology and risk of spread /infectivity to other areas of body as well as to other people.  Multiple treatments and methods may be required to clear warts and it is possible treatment may not be successful.  Treatment risks include discoloration; scarring and there is still potential for wart recurrence.  Treatment Plan: Cryo today  NEOPLASM OF UNCERTAIN BEHAVIOR OF SKIN Left Shoulder - Posterior Skin / nail biopsy Type of biopsy: tangential   Informed consent: discussed and consent obtained   Timeout: patient name, date of birth, surgical site, and  procedure verified   Anesthesia: the lesion was anesthetized in a standard fashion   Anesthetic:  1% lidocaine w/ epinephrine 1-100,000 buffered w/ 8.4% NaHCO3 Instrument used: DermaBlade   Outcome: patient tolerated procedure well   Post-procedure details: sterile dressing applied and wound care instructions given   Dressing type: bandage and petrolatum    Specimen 1 - Surgical pathology Differential Diagnosis: r/o dermatofibroma vs other  Check Margins: No VIRAL WARTS, UNSPECIFIED TYPE Left Upper Vermilion Lip Destruction of lesion - Left Upper Vermilion Lip Complexity: simple   Destruction method: cryotherapy   Informed consent: discussed and consent obtained   Timeout:  patient name, date of birth, surgical site, and procedure verified Lesion destroyed using liquid nitrogen: Yes   Region frozen until ice ball extended beyond lesion: Yes   Outcome: patient tolerated procedure well with no complications   Post-procedure details: wound care instructions given     Return in about 4 weeks (around 11/19/2023) for wart f/u.  LILLETTE Rollene Gobble, RN, am acting as scribe for RUFUS CHRISTELLA HOLY, MD .   Documentation: I have reviewed the above documentation for accuracy and completeness, and I agree with the above.  RUFUS CHRISTELLA HOLY, MD

## 2023-10-22 NOTE — Patient Instructions (Signed)
 Patient Handout: Wound Care for Skin Biopsy Site  Taking Care of Your Skin Biopsy Site  Proper care of the biopsy site is essential for promoting healing and minimizing scarring. This handout provides instructions on how to care for your biopsy site to ensure optimal recovery.  1. Cleaning the Wound:  Clean the biopsy site daily with gentle soap and water. Gently pat the area dry with a clean, soft towel. Avoid harsh scrubbing or rubbing the area, as this can irritate the skin and delay healing.  2. Applying Aquaphor and Bandage:  After cleaning the wound, apply a thin layer of Aquaphor ointment to the biopsy site. Cover the area with a sterile bandage to protect it from dirt, bacteria, and friction. Change the bandage daily or as needed if it becomes soiled or wet.  3. Continued Care for One Week:  Repeat the cleaning, Aquaphor application, and bandaging process daily for one week following the biopsy procedure. Keeping the wound clean and moist during this initial healing period will help prevent infection and promote optimal healing.  4. Massaging Aquaphor into the Area:  ---After one week, discontinue the use of bandages but continue to apply Aquaphor to the biopsy site. ----Gently massage the Aquaphor into the area using circular motions. ---Massaging the skin helps to promote circulation and prevent the formation of scar tissue.   Additional Tips:  Avoid exposing the biopsy site to direct sunlight during the healing process, as this can cause hyperpigmentation or worsen scarring. If you experience any signs of infection, such as increased redness, swelling, warmth, or drainage from the wound, contact your healthcare provider immediately. Follow any additional instructions provided by your healthcare provider for caring for the biopsy site and managing any discomfort. Conclusion:  Taking proper care of your skin biopsy site is crucial for ensuring optimal healing and  minimizing scarring. By following these instructions for cleaning, applying Aquaphor, and massaging the area, you can promote a smooth and successful recovery. If you have any questions or concerns about caring for your biopsy site, don't hesitate to contact your healthcare provider for guidance.   For areas treated with Liquid Nitrogen:  Keep clean with soap and water.  Apply Vaseline or Aquaphor twice daily.   Important Information  Due to recent changes in healthcare laws, you may see results of your pathology and/or laboratory studies on MyChart before the doctors have had a chance to review them. We understand that in some cases there may be results that are confusing or concerning to you. Please understand that not all results are received at the same time and often the doctors may need to interpret multiple results in order to provide you with the best plan of care or course of treatment. Therefore, we ask that you please give us  2 business days to thoroughly review all your results before contacting the office for clarification. Should we see a critical lab result, you will be contacted sooner.   If You Need Anything After Your Visit  If you have any questions or concerns for your doctor, please call our main line at [phone] If no one answers, please leave a voicemail as directed and we will return your call as soon as possible. Messages left after 4 pm will be answered the following business day.   You may also send us  a message via MyChart. We typically respond to MyChart messages within 1-2 business days.  For prescription refills, please ask your pharmacy to contact our office. Our fax number  is [phone].  If you have an urgent issue when the clinic is closed that cannot wait until the next business day, you can page your doctor at the number below.    Please note that while we do our best to be available for urgent issues outside of office hours, we are not available 24/7.    If you have an urgent issue and are unable to reach us , you may choose to seek medical care at your doctor's office, retail clinic, urgent care center, or emergency room.  If you have a medical emergency, please immediately call 911 or go to the emergency department. In the event of inclement weather, please call our main line at [phone] for an update on the status of any delays or closures.  Dermatology Medication Tips: Please keep the boxes that topical medications come in in order to help keep track of the instructions about where and how to use these. Pharmacies typically print the medication instructions only on the boxes and not directly on the medication tubes.   If your medication is too expensive, please contact our office at [phone] or send us  a message through MyChart.   We are unable to tell what your co-pay for medications will be in advance as this is different depending on your insurance coverage. However, we may be able to find a substitute medication at lower cost or fill out paperwork to get insurance to cover a needed medication.   If a prior authorization is required to get your medication covered by your insurance company, please allow us  1-2 business days to complete this process.  Drug prices often vary depending on where the prescription is filled and some pharmacies may offer cheaper prices.  The website www.goodrx.com contains coupons for medications through different pharmacies. The prices here do not account for what the cost may be with help from insurance (it may be cheaper with your insurance), but the website can give you the price if you did not use any insurance.  - You can print the associated coupon and take it with your prescription to the pharmacy.  - You may also stop by our office during regular business hours and pick up a GoodRx coupon card.  - If you need your prescription sent electronically to a different pharmacy, notify our office  through [facility] or by phone at [phone]

## 2023-10-25 LAB — SURGICAL PATHOLOGY

## 2023-10-28 ENCOUNTER — Ambulatory Visit: Payer: Self-pay | Admitting: Dermatology

## 2023-10-31 DIAGNOSIS — M6289 Other specified disorders of muscle: Secondary | ICD-10-CM | POA: Diagnosis not present

## 2023-10-31 DIAGNOSIS — M62838 Other muscle spasm: Secondary | ICD-10-CM | POA: Diagnosis not present

## 2023-10-31 DIAGNOSIS — L910 Hypertrophic scar: Secondary | ICD-10-CM | POA: Diagnosis not present

## 2023-10-31 DIAGNOSIS — M6281 Muscle weakness (generalized): Secondary | ICD-10-CM | POA: Diagnosis not present

## 2023-10-31 DIAGNOSIS — K5900 Constipation, unspecified: Secondary | ICD-10-CM | POA: Diagnosis not present

## 2023-11-20 ENCOUNTER — Ambulatory Visit (INDEPENDENT_AMBULATORY_CARE_PROVIDER_SITE_OTHER): Admitting: Dermatology

## 2023-11-20 ENCOUNTER — Encounter: Payer: Self-pay | Admitting: Dermatology

## 2023-11-20 VITALS — BP 114/63 | HR 79

## 2023-11-20 DIAGNOSIS — L910 Hypertrophic scar: Secondary | ICD-10-CM | POA: Diagnosis not present

## 2023-11-20 DIAGNOSIS — B079 Viral wart, unspecified: Secondary | ICD-10-CM

## 2023-11-20 DIAGNOSIS — D239 Other benign neoplasm of skin, unspecified: Secondary | ICD-10-CM | POA: Diagnosis not present

## 2023-11-20 DIAGNOSIS — M6281 Muscle weakness (generalized): Secondary | ICD-10-CM | POA: Diagnosis not present

## 2023-11-20 DIAGNOSIS — M62838 Other muscle spasm: Secondary | ICD-10-CM | POA: Diagnosis not present

## 2023-11-20 DIAGNOSIS — M6289 Other specified disorders of muscle: Secondary | ICD-10-CM | POA: Diagnosis not present

## 2023-11-20 DIAGNOSIS — K5900 Constipation, unspecified: Secondary | ICD-10-CM | POA: Diagnosis not present

## 2023-11-20 NOTE — Patient Instructions (Signed)

## 2023-11-20 NOTE — Progress Notes (Signed)
   Follow-Up Visit   Subjective  Sheila Patrick is a 63 y.o. female who presents for the following: return for follow up on treatment of wart. She feels like it is still present. She is also concerned about the scar on her left upper arm, after biopsy.  The following portions of the chart were reviewed this encounter and updated as appropriate: medications, allergies, medical history  Review of Systems:  No other skin or systemic complaints except as noted in HPI or Assessment and Plan.  Objective  Well appearing patient in no apparent distress; mood and affect are within normal limits.  A focused examination was performed of the following areas: Lips Left arm  Relevant exam findings are noted in the Assessment and Plan.    Assessment & Plan  WART Exam: verrucous papule(s) on left upper lip  Counseling Discussed viral / HPV (Human Papilloma Virus) etiology and risk of spread /infectivity to other areas of body as well as to other people.  Multiple treatments and methods may be required to clear warts and it is possible treatment may not be successful.  Treatment risks include discoloration; scarring and there is still potential for wart recurrence.  Treatment Plan: Spot previously treated with cryo.  Spot is better but it hasn't completely resolved.  We went over treatment options including cryo, efudex cream, or observation. Patient has decided to just watch it and possibly use some over the counter medication.  She will call if she wants further treatment.   DERMATOFIBROMA Exam: Firm pink/brown papulenodule with dimple sign. Treatment Plan: A dermatofibroma is a benign growth possibly related to trauma, such as an insect bite, cut from shaving, or inflamed acne-type bump.  Treatment options to remove include shave or excision with resulting scar and risk of recurrence.  Since benign-appearing and not bothersome, will observe for now.     Return in 2 months (on 01/20/2024) for  wart f/u.  I, Berwyn Lesches, Surg Tech III, am acting as scribe for RUFUS CHRISTELLA HOLY, MD.   Documentation: I have reviewed the above documentation for accuracy and completeness, and I agree with the above.  RUFUS CHRISTELLA HOLY, MD

## 2023-12-03 DIAGNOSIS — L910 Hypertrophic scar: Secondary | ICD-10-CM | POA: Diagnosis not present

## 2023-12-03 DIAGNOSIS — M62838 Other muscle spasm: Secondary | ICD-10-CM | POA: Diagnosis not present

## 2023-12-03 DIAGNOSIS — K5900 Constipation, unspecified: Secondary | ICD-10-CM | POA: Diagnosis not present

## 2023-12-03 DIAGNOSIS — M6289 Other specified disorders of muscle: Secondary | ICD-10-CM | POA: Diagnosis not present

## 2023-12-03 DIAGNOSIS — M6281 Muscle weakness (generalized): Secondary | ICD-10-CM | POA: Diagnosis not present

## 2023-12-20 DIAGNOSIS — H52203 Unspecified astigmatism, bilateral: Secondary | ICD-10-CM | POA: Diagnosis not present

## 2023-12-20 DIAGNOSIS — H0100A Unspecified blepharitis right eye, upper and lower eyelids: Secondary | ICD-10-CM | POA: Diagnosis not present

## 2023-12-20 DIAGNOSIS — H524 Presbyopia: Secondary | ICD-10-CM | POA: Diagnosis not present

## 2023-12-20 DIAGNOSIS — H2513 Age-related nuclear cataract, bilateral: Secondary | ICD-10-CM | POA: Diagnosis not present

## 2023-12-20 DIAGNOSIS — H43813 Vitreous degeneration, bilateral: Secondary | ICD-10-CM | POA: Diagnosis not present

## 2023-12-20 DIAGNOSIS — H04123 Dry eye syndrome of bilateral lacrimal glands: Secondary | ICD-10-CM | POA: Diagnosis not present

## 2023-12-20 DIAGNOSIS — H25013 Cortical age-related cataract, bilateral: Secondary | ICD-10-CM | POA: Diagnosis not present

## 2023-12-20 DIAGNOSIS — H0100B Unspecified blepharitis left eye, upper and lower eyelids: Secondary | ICD-10-CM | POA: Diagnosis not present

## 2024-01-08 DIAGNOSIS — K5909 Other constipation: Secondary | ICD-10-CM | POA: Diagnosis not present

## 2024-01-08 DIAGNOSIS — M62838 Other muscle spasm: Secondary | ICD-10-CM | POA: Diagnosis not present

## 2024-01-08 DIAGNOSIS — M6281 Muscle weakness (generalized): Secondary | ICD-10-CM | POA: Diagnosis not present

## 2024-01-13 ENCOUNTER — Ambulatory Visit: Payer: Self-pay

## 2024-01-13 NOTE — Telephone Encounter (Signed)
 Patient calling to report a productive cough and nasal discharge with yellow and green mucous. Patient reports symptoms started on Saturday. Reports feeling better today. Wants advice for OTC medications currently. Patient given care advise for OTC medications. Patient given instructions to call back if symptoms worsen.   FYI Only or Action Required?: FYI only for provider.  Patient was last seen in primary care on 09/06/2023 by Jolinda Norene HERO, DO.  Called Nurse Triage reporting Cough.  Symptoms began Saturday.  Interventions attempted: OTC medications: Mucinex, allergy OTC medication and Rest, hydration, or home remedies.  Symptoms are: unchanged.  Triage Disposition: Home Care  Patient/caregiver understands and will follow disposition?: Yes  Copied from CRM [id number]. Topic: Clinical - Red Word Triage >> Jan 13, 2024 10:29 AM Tobias CROME wrote: Red Word that prompted transfer to Nurse Triage: green and yellow mucous, chest pain in the middle Reason for Disposition  Cough with cold symptoms (e.g., runny nose, postnasal drip, throat clearing)  Answer Assessment - Initial Assessment Questions 1. ONSET: When did the cough begin?      Started Saturday 2. SEVERITY: How bad is the cough today?      moderate 3. SPUTUM: Describe the color of your sputum (e.g., none, dry cough; clear, white, yellow, green)     Yellow and green 4. HEMOPTYSIS: Are you coughing up any blood? If Yes, ask: How much? (e.g., flecks, streaks, tablespoons, etc.)     no 5. DIFFICULTY BREATHING: Are you having difficulty breathing? If Yes, ask: How bad is it? (e.g., mild, moderate, severe)      no 6. FEVER: Do you have a fever? If Yes, ask: What is your temperature, how was it measured, and when did it start?     No measured temperature but reports sweating in bed 7. CARDIAC HISTORY: Do you have any history of heart disease? (e.g., heart attack, congestive heart failure)      no 8. LUNG  HISTORY: Do you have any history of lung disease?  (e.g., pulmonary embolus, asthma, emphysema)     no 9. PE RISK FACTORS: Do you have a history of blood clots? (or: recent major surgery, recent prolonged travel, bedridden)     no 10. OTHER SYMPTOMS: Do you have any other symptoms? (e.g., runny nose, wheezing, chest pain)       Runny nose-nasal discharge of yellow and green 12. TRAVEL: Have you traveled out of the country in the last month? (e.g., travel history, exposures)       no  Protocols used: Cough - Acute Productive-A-AH

## 2024-01-13 NOTE — Telephone Encounter (Signed)
 noted

## 2024-01-15 ENCOUNTER — Ambulatory Visit: Payer: Self-pay

## 2024-01-15 DIAGNOSIS — R1021 Pelvic and perineal pain right side: Secondary | ICD-10-CM | POA: Diagnosis not present

## 2024-01-15 DIAGNOSIS — N3020 Other chronic cystitis without hematuria: Secondary | ICD-10-CM | POA: Diagnosis not present

## 2024-01-15 NOTE — Telephone Encounter (Signed)
   Triage Disposition: See Physician Within 24 Hours  Patient/caregiver understands and will follow disposition?: YesFYI Only or Action Required?: Action required by provider: request for appointment.  Patient was last seen in primary care on 09/06/2023 by Jolinda Norene HERO, DO.  Called Nurse Triage reporting Cough.  Symptoms began several days ago.  Interventions attempted: OTC medications:  SABRA  Symptoms are: unchanged.Has seen some blood in mucus, concerned.  Triage Disposition: See Physician Within 24 Hours  Patient/caregiver understands and will follow disposition?: YesCopied from CRM #[id number]. Topic: Clinical - Red Word Triage >> Jan 15, 2024  1:24 PM Larissa S wrote: [facility] that prompted transfer to Nurse Triage: cough with blood in mucous Reason for Disposition  [1] Continuous (nonstop) coughing interferes with work or school AND [2] no improvement using cough treatment per Care Advice  Answer Assessment - Initial Assessment Questions 1. ONSET: When did the cough begin?      Last Thursday 2. SEVERITY: How bad is the cough today?      It's a little better 3. SPUTUM: Describe the color of your sputum (e.g., none, dry cough; clear, white, yellow, green)     Had blood in it, yellow-green 4. HEMOPTYSIS: Are you coughing up any blood? If Yes, ask: How much? (e.g., flecks, streaks, tablespoons, etc.)     yes 5. DIFFICULTY BREATHING: Are you having difficulty breathing? If Yes, ask: How bad is it? (e.g., mild, moderate, severe)      no 6. FEVER: Do you have a fever? If Yes, ask: What is your temperature, how was it measured, and when did it start?     no 7. CARDIAC HISTORY: Do you have any history of heart disease? (e.g., heart attack, congestive heart failure)      no 8. LUNG HISTORY: Do you have any history of lung disease?  (e.g., pulmonary embolus, asthma, emphysema)     no 9. PE RISK FACTORS: Do you have a history of blood clots? (or: recent major  surgery, recent prolonged travel, bedridden)     no 10. OTHER SYMPTOMS: Do you have any other symptoms? (e.g., runny nose, wheezing, chest pain)       Sinus drainage 11. PREGNANCY: Is there any chance you are pregnant? When was your last menstrual period?       no 12. TRAVEL: Have you traveled out of the country in the last month? (e.g., travel history, exposures)       no  Protocols used: Cough - Acute Productive-A-AH

## 2024-01-15 NOTE — Telephone Encounter (Signed)
 Appointment scheduled.

## 2024-01-16 ENCOUNTER — Ambulatory Visit (INDEPENDENT_AMBULATORY_CARE_PROVIDER_SITE_OTHER): Admitting: Family Medicine

## 2024-01-16 ENCOUNTER — Encounter: Payer: Self-pay | Admitting: Family Medicine

## 2024-01-16 VITALS — BP 106/61 | HR 81 | Temp 97.6°F | Ht 63.0 in | Wt 109.0 lb

## 2024-01-16 DIAGNOSIS — J329 Chronic sinusitis, unspecified: Secondary | ICD-10-CM

## 2024-01-16 DIAGNOSIS — J40 Bronchitis, not specified as acute or chronic: Secondary | ICD-10-CM | POA: Diagnosis not present

## 2024-01-16 MED ORDER — PROMETHAZINE-DM 6.25-15 MG/5ML PO SYRP: 5.0000 mL | ORAL_SOLUTION | Freq: Four times a day (QID) | ORAL | 0 refills | Status: AC | PRN

## 2024-01-16 MED ORDER — BETAMETHASONE SOD PHOS & ACET 6 (3-3) MG/ML IJ SUSP
6.0000 mg | Freq: Once | INTRAMUSCULAR | Status: AC
  Administered 2024-01-16: 6 mg via INTRAMUSCULAR

## 2024-01-16 MED ORDER — AMOXICILLIN-POT CLAVULANATE 875-125 MG PO TABS: 1.0000 | ORAL_TABLET | Freq: Two times a day (BID) | ORAL | 0 refills | Status: AC

## 2024-01-16 NOTE — Progress Notes (Signed)
 Subjective:  Patient ID: Sheila Patrick, female    DOB: 1960-10-11  Age: 63 y.o. MRN: [id number]  CC: Cough (Ongoing cough since Saturday. Pt having drainage with blood in it. Taking mucinex. Was having sinus pressure and earache but that has resolved. ) and tonsil stone (Possible tonsil stone. Feels like something is stuck on right side of throat. Hx of tonsil stones. )   HPI  Discussed the use of AI scribe software for clinical note transcription with the patient, who gave verbal consent to proceed.  History of Present Illness Sheila Patrick is a 63 year old female who presents with a cough and sinus symptoms.  She has been experiencing significant drainage with a little blood, which she is able to expel by coughing. The symptoms have persisted for more than five days, with a notable worsening on Saturday. She describes having a sore throat, sinus pressure, stuffy nose, earaches, and a sore throat, all of which have improved somewhat. She has been taking Mucinex to manage the symptoms, but finds it strong and prefers not to continue it.  No fever or shortness of breath, although she mentions sweating in bed without a fever. She is concerned about being contagious as she has a wedding to attend on Saturday. She has not taken antibiotics in a long time and expresses concern about their impact on her immune system, although her sister advised her to seek medical attention due to the possibility of pneumonia.  She has no known allergies to penicillin.          09/06/2023    8:56 AM 01/15/2023    1:04 PM 09/04/2022    9:10 AM  Depression screen PHQ 2/9  Decreased Interest 0 0 0  Down, Depressed, Hopeless 0 0 0  PHQ - 2 Score 0 0 0  Altered sleeping 0 0 1  Tired, decreased energy 0 0 0  Change in appetite 0 0 0  Feeling bad or failure about yourself  0 0 0  Trouble concentrating 0 0 0  Moving slowly or fidgety/restless 0 0 0  Suicidal thoughts 0 0 0  PHQ-9 Score 0 0 1   Difficult doing work/chores Not difficult at all Not difficult at all Not difficult at all    History Sheila Patrick has a past medical history of ARTHRITIS, CERVICAL SPINE (04/11/2009), Back pain, Neck pain, PONV (postoperative nausea and vomiting), Shoulder pain, and Vaginal pain (04/10/2017).   She has a past surgical history that includes right breast lumpectomy (2002); Vesicovaginal fistula closure w/ TAH (1998); Nasal sinus surgery (1994); Abdominal hysterectomy; and Colonoscopy (N/A, 01/21/2019).   Her family history includes Alcohol abuse in her brother and sister; Asthma in her mother and sister; COPD in her sister; Cancer in her brother and father; Cirrhosis in her brother; Diabetes in her sister; Heart attack (age of onset: 57) in her sister; Heart disease in her sister; Hyperlipidemia in her sister; Hypertension in her sister and sister; Lung cancer in her father; Thyroid  cancer in her sister.She reports that she has never smoked. She has never used smokeless tobacco. She reports that she does not currently use alcohol. She reports that she does not use drugs.    ROS Review of Systems  Objective:  BP 106/61   Pulse 81   Temp 97.6 F (36.4 C)   Ht 5' 3 (1.6 m)   Wt 109 lb (49.4 kg)   SpO2 99%   BMI 19.31 kg/m   BP Readings from Last  3 Encounters:  01/16/24 106/61  11/20/23 114/63  09/06/23 127/68    Wt Readings from Last 3 Encounters:  01/16/24 109 lb (49.4 kg)  09/06/23 109 lb 9.6 oz (49.7 kg)  01/15/23 107 lb (48.5 kg)     Physical Exam Physical Exam GENERAL: Alert, cooperative, well developed, no acute distress HEENT: Normocephalic, normal oropharynx, moist mucous membranes, Nsal passages swollen & red ears normal, throat normal NECK: Possible lymph node swelling CHEST: bronchitic change without overt rales or wheezing CARDIOVASCULAR: Normal heart rate and rhythm, S1 and S2 normal without murmurs EXTREMITIES: No cyanosis or edema NEUROLOGICAL: Cranial nerves  grossly intact, moves all extremities without gross motor or sensory deficit   Assessment & Plan:  Sinobronchitis -     Betamethasone Sod Phos & Acet  Other orders -     Amoxicillin-Pot Clavulanate; Take 1 tablet by mouth 2 (two) times daily. Take all of this medication  Dispense: 20 tablet; Refill: 0 -     Promethazine-DM; Take 5 mLs by mouth 4 (four) times daily as needed for cough.  Dispense: 240 mL; Refill: 0    Assessment and Plan Assessment & Plan Acute upper respiratory infection   She presents with significant postnasal drainage, cough, sore throat, sinus pressure, and occasional epistaxis for at least five days. There is no fever or dyspnea. Possible lymphadenopathy is likely due to infection. The differential includes bacterial versus viral etiology, with clinical suspicion leaning towards bacterial given symptomatology and duration. Administer a cortisone shot to reduce inflammation. Prescribe Augmentin for 10 days to address the bacterial infection. Prescribe cough syrup to manage cough symptoms. Discontinue Mucinex if desired.       Follow-up: Return if symptoms worsen or fail to improve.  Butler Der, M.D.

## 2024-01-22 ENCOUNTER — Ambulatory Visit: Admitting: Dermatology

## 2024-01-22 ENCOUNTER — Encounter: Payer: Self-pay | Admitting: Physician Assistant

## 2024-01-22 ENCOUNTER — Ambulatory Visit: Admitting: Physician Assistant

## 2024-01-22 VITALS — BP 122/76 | HR 65

## 2024-01-22 DIAGNOSIS — D2362 Other benign neoplasm of skin of left upper limb, including shoulder: Secondary | ICD-10-CM | POA: Diagnosis not present

## 2024-01-22 DIAGNOSIS — L821 Other seborrheic keratosis: Secondary | ICD-10-CM

## 2024-01-22 DIAGNOSIS — D239 Other benign neoplasm of skin, unspecified: Secondary | ICD-10-CM

## 2024-01-22 DIAGNOSIS — B078 Other viral warts: Secondary | ICD-10-CM

## 2024-01-22 NOTE — Patient Instructions (Signed)

## 2024-01-22 NOTE — Progress Notes (Unsigned)
   Follow-Up Visit   Subjective  Sheila Patrick is a 63 y.o. female who presents for the following: Wart  Patient present today for follow up visit for warts. Patient was last evaluated on 11/20/23. At this visit patient was advised about different treatments as in cryo and efudex cream and some over the counter medication . Patient reports sxs are better. Patient denies medication changes.  The following portions of the chart were reviewed this encounter and updated as appropriate: medications, allergies, medical history  Review of Systems:  No other skin or systemic complaints except as noted in HPI or Assessment and Plan.  Objective  Well appearing patient in no apparent distress; mood and affect are within normal limits.    A focused examination was performed of the following areas: Face and left arm  Relevant exam findings are noted in the Assessment and Plan.  SEBORRHEIC KERATOSIS - face - Stuck-on, waxy, tan-brown papules and/or plaques  - Benign-appearing - Discussed benign etiology and prognosis. - Observe - Call for any changes  DERMATOFIBROMA - left upper arm Exam: Firm pink/brown papulenodule with dimple sign.  Treatment Plan: A dermatofibroma is a benign growth possibly related to trauma, such as an insect bite, cut from shaving, or inflamed acne-type bump.  Treatment options to remove include shave or excision with resulting scar and risk of recurrence.  Since benign-appearing and not bothersome, will observe for now.      Assessment & Plan       Return if symptoms worsen or fail to improve.  I, Doyce Pan, CMA, am acting as scribe for Lynix Bonine K, PA-C.   Documentation: I have reviewed the above documentation for accuracy and completeness, and I agree with the above.  Octave Montrose K, PA-C

## 2024-03-20 ENCOUNTER — Telehealth: Payer: Self-pay | Admitting: Family Medicine

## 2024-03-20 ENCOUNTER — Encounter: Payer: Self-pay | Admitting: Family Medicine

## 2024-03-20 NOTE — Telephone Encounter (Signed)
 Patient advice request message forwarded to provider

## 2024-03-20 NOTE — Telephone Encounter (Signed)
 Patient stated she would come to the office to pick paperwork up one day next week. Paperwork placed at the front.

## 2024-03-20 NOTE — Telephone Encounter (Signed)
 Copied from CRM [id number]. Topic: General - Other >> Mar 20, 2024 12:39 PM Lauren C wrote: Reason for CRM: FYI- Pt will be sending a medical necessity form for dx osteopenia for vit d + K2 and magnesium and trace minerals. Will send via mychart non-urgent medical questions.

## 2024-03-20 NOTE — Telephone Encounter (Signed)
 Can you email to address provided?

## 2024-09-14 ENCOUNTER — Encounter: Payer: Self-pay | Admitting: Family Medicine
